# Patient Record
Sex: Male | Born: 1970 | Race: White | Hispanic: Yes | Smoking: Former smoker
Health system: Southern US, Community
[De-identification: ages and names within clinical notes are randomized; demographics above are authoritative.]

## PROBLEM LIST (undated history)

## (undated) DIAGNOSIS — J45909 Unspecified asthma, uncomplicated: Secondary | ICD-10-CM

## (undated) DIAGNOSIS — I10 Essential (primary) hypertension: Secondary | ICD-10-CM

## (undated) DIAGNOSIS — M1990 Unspecified osteoarthritis, unspecified site: Secondary | ICD-10-CM

## (undated) DIAGNOSIS — T7840XA Allergy, unspecified, initial encounter: Secondary | ICD-10-CM

## (undated) HISTORY — DX: Allergy, unspecified, initial encounter: T78.40XA

## (undated) HISTORY — DX: Unspecified osteoarthritis, unspecified site: M19.90

## (undated) HISTORY — DX: Essential (primary) hypertension: I10

## (undated) HISTORY — PX: APPENDECTOMY: SHX54

---

## 2013-11-20 ENCOUNTER — Telehealth: Payer: Self-pay | Admitting: *Deleted

## 2013-11-20 ENCOUNTER — Emergency Department
Admission: EM | Admit: 2013-11-20 | Discharge: 2013-11-20 | Disposition: A | Discharge status: Home / Self Care | Payer: BC Managed Care – PPO | Source: Home / Self Care | Attending: Family Medicine | Admitting: Family Medicine

## 2013-11-20 ENCOUNTER — Emergency Department (INDEPENDENT_AMBULATORY_CARE_PROVIDER_SITE_OTHER): Payer: BC Managed Care – PPO

## 2013-11-20 ENCOUNTER — Encounter: Payer: Self-pay | Admitting: Emergency Medicine

## 2013-11-20 DIAGNOSIS — B350 Tinea barbae and tinea capitis: Secondary | ICD-10-CM

## 2013-11-20 DIAGNOSIS — L738 Other specified follicular disorders: Secondary | ICD-10-CM

## 2013-11-20 DIAGNOSIS — M1712 Unilateral primary osteoarthritis, left knee: Secondary | ICD-10-CM

## 2013-11-20 DIAGNOSIS — IMO0002 Reserved for concepts with insufficient information to code with codable children: Secondary | ICD-10-CM

## 2013-11-20 DIAGNOSIS — M25569 Pain in unspecified knee: Secondary | ICD-10-CM

## 2013-11-20 DIAGNOSIS — M1710 Unilateral primary osteoarthritis, unspecified knee: Secondary | ICD-10-CM

## 2013-11-20 HISTORY — DX: Unspecified asthma, uncomplicated: J45.909

## 2013-11-20 MED ORDER — OXYCODONE-ACETAMINOPHEN 5-325 MG PO TABS: ORAL_TABLET | ORAL | Status: DC

## 2013-11-20 MED ORDER — MELOXICAM 15 MG PO TABS: 15.0000 mg | ORAL_TABLET | Freq: Every day | ORAL | Status: DC

## 2013-11-20 MED ORDER — DOXYCYCLINE HYCLATE 100 MG PO CAPS: 100.0000 mg | ORAL_CAPSULE | Freq: Two times a day (BID) | ORAL | Status: DC

## 2013-11-20 MED ORDER — DERMA-SMOOTHE/FS BODY 0.01 % EX OIL: TOPICAL_OIL | CUTANEOUS | Status: DC

## 2013-11-20 NOTE — ED Provider Notes (Signed)
CSN: [id number]     Arrival date & time 11/20/13  1202 History   First MD Initiated Contact with Patient 11/20/13 1257     Chief Complaint  Patient presents with  . Knee Pain      HPI Comments: Patient states that he has had gradually increasing pain in his left knee for about two years, becoming worse over the past month.  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  The knee often feels as if it may give way but not lock up.  He has had poor response to Aleve, and Flexeril helps somewhat at night.  He has taken his wife's Percocet at night to sleep.  He also reports that he has gained about 175 pounds over the past two years.  He also complains of recurrent pruritic sores on his posterior scalp.  Patient is a 43 y.o. male presenting with knee pain. The history is provided by the patient and the spouse.  Knee Pain Location:  Knee Time since incident:  1 month Injury: no   Knee location:  L knee Pain details:    Quality:  Aching   Radiates to:  Does not radiate   Severity:  Severe   Onset quality:  Gradual   Duration:  1 month   Timing:  Constant   Progression:  Worsening Chronicity:  Chronic Dislocation: no   Prior injury to area:  No Relieved by:  Nothing Worsened by:  Bearing weight Ineffective treatments:  NSAIDs Associated symptoms: decreased ROM, stiffness and swelling   Associated symptoms: no fever, no muscle weakness, no numbness and no tingling   Risk factors: obesity   Risk factors: no frequent fractures and no recent illness     Past Medical History  Diagnosis Date  . Asthma    Past Surgical History  Procedure Laterality Date  . Appendectomy     Family History  Problem Relation Age of Onset  . Diabetes Mother   . Hypertension Father    History  Substance Use Topics  . Smoking status: Current Every Day Smoker -- 0.50 packs/day    Types: Cigarettes  . Smokeless tobacco: Never Used  . Alcohol Use: Yes     Review of Systems  Constitutional: Negative for fever.  Musculoskeletal: Positive for stiffness.  Skin: Positive for rash.       He complains of pruritic sores on his posterior scalp    Allergies  Bee venom  Home Medications   Current Outpatient Rx  Name  Route  Sig  Dispense  Refill  . albuterol (PROVENTIL) (2.5 MG/3ML) 0.083% nebulizer solution   Nebulization   Take 2.5 mg by nebulization every 6 (six) hours as needed for wheezing or shortness of breath.         Marland Kitchen. albuterol (PROVENTIL) (5 MG/ML) 0.5% nebulizer solution   Nebulization   Take 2.5 mg by nebulization every 6 (six) hours as needed for wheezing or shortness of breath.         . doxycycline (VIBRAMYCIN) 100 MG capsule   Oral   Take 1 capsule (100 mg total) by mouth 2 (two) times daily.   20 capsule   0   . Fluocinolone Acetonide (DERMA-SMOOTHE/FS BODY) 0.01 % OIL      Apply to scalp at bedtime; wash off next morning   1 Bottle   0   . meloxicam (MOBIC) 15 MG tablet   Oral   Take  1 tablet (15 mg total) by mouth daily. Take with food each morning   15 tablet   0   . oxyCODONE-acetaminophen (ROXICET) 5-325 MG per tablet      Take one tab by mouth at bedtime as needed for pain   8 tablet   0    BP 133/90  Pulse 92  Resp 16  Ht 5\' 7"  (1.702 m)  Wt 321 lb (145.605 kg)  BMI 50.26 kg/m2  SpO2 98% Physical Exam Nursing notes and Vital Signs reviewed. Appearance:  Patient appears stated age, and in no acute distress.  Patient is obese (BMI 50.3) Head:  Occipital scalp has scattered follicular excoriations 3 to 5mm dia. Eyes:  Pupils are equal, round, and reactive to light and accomodation.  Extraocular movement is intact.  Conjunctivae are not inflamed  Pharynx:  Normal Neck:  Supple.  No adenopathy Lungs:  Clear to auscultation.  Breath sounds are equal.  Heart:  Regular rate and rhythm without murmurs, rubs, or gallops.  Extremities:  No edema.  Left knee has decreased range of motion:   Difficulty with full passive flexion.  Tenderness over medial joint line.  Knee stable.  Negative McMurray test.  Mild swelling but no warmth or erythema.    ED Course  Procedures      Imaging Review Dg Knee Complete 4 Views Left  11/20/2013   CLINICAL DATA Left knee pain for 2 months, no injury  EXAM LEFT KNEE - COMPLETE 4+ VIEW  COMPARISON None.  FINDINGS There is only mild degenerative spurring from the medial compartment. No fracture is seen and no definite effusion is noted although the lateral view is suboptimal to assess for effusion.  IMPRESSION Mild degenerative change medially.  No acute abnormality.  SIGNATURE  Electronically Signed   By: Dwyane Dee M.D.   On: 11/20/2013 13:31     MDM   1. 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 Severe obesity (BMI >= 40)   3. Folliculitis barbae    Continue Flexeril Begin Mobic.  Begin doxycycline and DermaSmoothe FS.  Rx for Percocet at bedtime Followup with Dr. Rodney Langton tomorrow; consider joint injection.     Lattie Haw, MD 11/20/13 [phone]

## 2013-11-20 NOTE — Discharge Instructions (Signed)
Wear and Tear Disorders of the Knee (Arthritis, Osteoarthritis)  Everyone will experience wear and tear injuries (arthritis, osteoarthritis) of the knee. These are the changes we all get as we age. They come from the joint stress of daily living. The amount of cartilage damage in your knee and your symptoms determine if you need surgery. Mild problems require approximately two months recovery time. More severe problems take several months to recover. With mild problems, your surgeon may find worn and rough cartilage surfaces. With severe changes, your surgeon may find cartilage that has completely worn away and exposed the bone. Loose bodies of bone and cartilage, bone spurs (excess bone growth), and injuries to the menisci (cushions between the large bones of your leg) are also common. All of these problems can cause pain.  For a mild wear and tear problem, rough cartilage may simply need to be shaved and smoothed. For more severe problems with areas of exposed bone, your surgeon may use an instrument for roughing up the bone surfaces to stimulate new cartilage growth. Loose bodies are usually removed. Torn menisci may be trimmed or repaired.  ABOUT THE ARTHROSCOPIC PROCEDURE  Arthroscopy is a surgical technique. It allows your orthopedic surgeon to diagnose and treat your knee injury with accuracy. The surgeon looks into your knee through a small scope. The scope is like a small (pencil-sized) telescope. Arthroscopy is less invasive than open knee surgery. You can expect a more rapid recovery. After the procedure, you will be moved to a recovery area until most of the effects of the medication have worn off. Your caregiver will discuss the test results with you.  RECOVERY  The severity of the arthritis and the type of procedure performed will determine recovery time. Other important factors include age, physical condition, medical conditions, and the type of rehabilitation program. Strengthening your muscles after  arthroscopy helps guarantee a better recovery. Follow your caregiver's instructions. Use crutches, rest, elevate, ice, and do knee exercises as instructed. Your caregivers will help you and instruct you with exercises and other physical therapy required to regain your mobility, muscle strength, and functioning following surgery. Only take over-the-counter or prescription medicines for pain, discomfort, or fever as directed by your caregiver.   SEEK MEDICAL CARE IF:   · There is increased bleeding (more than a small spot) from the wound.  · You notice redness, swelling, or increasing pain in the wound.  · Pus is coming from wound.  · You develop an unexplained oral temperature above 102° F (38.9° C) , or as your caregiver suggests.  · You notice a foul smell coming from the wound or dressing.  · You have severe pain with motion of the knee.  SEEK IMMEDIATE MEDICAL CARE IF:   · You develop a rash.  · You have difficulty breathing.  · You have any allergic problems.  MAKE SURE YOU:   · Understand these instructions.  · Will watch your condition.  · Will get help right away if you are not doing well or get worse.  Document Released: 08/26/2000 Document Revised: 11/21/2011 Document Reviewed: 01/23/2008  [employer]® Patient Information ©2014 [employer].

## 2013-11-20 NOTE — ED Notes (Signed)
Jonathan Villarreal c/o left knee pain and swelling x 1 month without injury. No previous injury or surgeries.

## 2013-11-21 ENCOUNTER — Ambulatory Visit (INDEPENDENT_AMBULATORY_CARE_PROVIDER_SITE_OTHER): Payer: BC Managed Care – PPO | Admitting: Sports Medicine

## 2013-11-21 ENCOUNTER — Encounter: Payer: Self-pay | Admitting: Sports Medicine

## 2013-11-21 VITALS — BP 147/82 | HR 88 | Ht 66.0 in | Wt 324.0 lb

## 2013-11-21 DIAGNOSIS — IMO0002 Reserved for concepts with insufficient information to code with codable children: Secondary | ICD-10-CM

## 2013-11-21 DIAGNOSIS — M1712 Unilateral primary osteoarthritis, left knee: Secondary | ICD-10-CM

## 2013-11-21 DIAGNOSIS — E669 Obesity, unspecified: Secondary | ICD-10-CM | POA: Insufficient documentation

## 2013-11-21 DIAGNOSIS — M170 Bilateral primary osteoarthritis of knee: Secondary | ICD-10-CM | POA: Insufficient documentation

## 2013-11-21 DIAGNOSIS — M1710 Unilateral primary osteoarthritis, unspecified knee: Secondary | ICD-10-CM

## 2013-11-21 MED ORDER — PHENTERMINE HCL 37.5 MG PO CAPS: 37.5000 mg | ORAL_CAPSULE | ORAL | Status: DC

## 2013-11-21 NOTE — Assessment & Plan Note (Signed)
Phentermine, nutrition referral.

## 2013-11-21 NOTE — Progress Notes (Signed)
   Subjective:    I'm seeing this patient as a consultation for: Dr. Cathren HarshBeese  CC: Left knee pain  HPI: This is a pleasant 43 year old male, for many years she's had pain that he localizes the joint lines bilaterally, moderate, persistent, gelling in the morning, worse at the end of the day as well after a long period of walking. He was given oral NSAIDs which have been ineffective, he has been using his wife's Percocet. He has never had an injection.  Obesity: Present for a long time, understands this worsens his osteoarthritis symptoms, he has no PCP, and would like to discuss weight loss medication.  Past medical history, Surgical history, Family history not pertinant except as noted below, Social history, Allergies, and medications have been entered into the medical record, reviewed, and no changes needed.   Review of Systems: No headache, visual changes, nausea, vomiting, diarrhea, constipation, dizziness, abdominal pain, skin rash, fevers, chills, night sweats, weight loss, swollen lymph nodes, body aches, joint swelling, muscle aches, chest pain, shortness of breath, mood changes, visual or auditory hallucinations.   Objective:   General: Well Developed, well nourished, and in no acute distress.  Neuro/Psych: Alert and oriented x3, extra-ocular muscles intact, able to move all 4 extremities, sensation grossly intact. Skin: Warm and dry, no rashes noted.  Respiratory: Not using accessory muscles, speaking in full sentences, trachea midline.  Cardiovascular: Pulses palpable, no extremity edema. Abdomen: Does not appear distended. Left Knee: Minimal swelling, tenderness to palpation particularly along the medial joint line. ROM full in flexion and extension and lower leg rotation. Ligaments with solid consistent endpoints including ACL, PCL, LCL, MCL. Negative Mcmurray's, Apley's, and Thessalonian tests. Non painful patellar compression. Patellar glide without crepitus. Patellar and  quadriceps tendons unremarkable. Hamstring and quadriceps strength is normal.   Procedure: Real-time Ultrasound Guided Injection of left knee Device: GE Logiq E  Verbal informed consent obtained.  Time-out conducted.  Noted no overlying erythema, induration, or other signs of local infection.  Skin prepped in a sterile fashion.  Local anesthesia: Topical Ethyl chloride.  With sterile technique and under real time ultrasound guidance:  25-gauge needle advanced into the suprapatellar recess, 2 cc Kenalog 40, 4 cc lidocaine injected easily. Completed without difficulty  Pain immediately resolved suggesting accurate placement of the medication.  Advised to call if fevers/chills, erythema, induration, drainage, or persistent bleeding.  Images permanently stored and available for review in the ultrasound unit.  Impression: Technically successful ultrasound guided injection. Impression and Recommendations:   This case required medical decision making of moderate complexity.

## 2013-11-21 NOTE — Assessment & Plan Note (Signed)
Persistent pain despite Mobic, Percocet. He was seen in urgent care, and referred to me for further evaluation and definitive treatment. Injection as above, formal physical therapy. Return to see me in one month

## 2013-12-19 ENCOUNTER — Ambulatory Visit: Payer: BC Managed Care – PPO | Admitting: Sports Medicine

## 2013-12-31 ENCOUNTER — Ambulatory Visit (INDEPENDENT_AMBULATORY_CARE_PROVIDER_SITE_OTHER): Payer: BC Managed Care – PPO | Admitting: Sports Medicine

## 2013-12-31 ENCOUNTER — Encounter: Payer: Self-pay | Admitting: Sports Medicine

## 2013-12-31 VITALS — BP 141/87 | HR 87 | Ht 66.0 in | Wt 321.0 lb

## 2013-12-31 DIAGNOSIS — IMO0002 Reserved for concepts with insufficient information to code with codable children: Secondary | ICD-10-CM

## 2013-12-31 DIAGNOSIS — M1710 Unilateral primary osteoarthritis, unspecified knee: Secondary | ICD-10-CM

## 2013-12-31 DIAGNOSIS — E669 Obesity, unspecified: Secondary | ICD-10-CM

## 2013-12-31 DIAGNOSIS — M1712 Unilateral primary osteoarthritis, left knee: Secondary | ICD-10-CM

## 2013-12-31 MED ORDER — PHENTERMINE HCL 37.5 MG PO CAPS: 37.5000 mg | ORAL_CAPSULE | ORAL | Status: DC

## 2013-12-31 NOTE — Progress Notes (Signed)
  Subjective:    CC: Follow up  HPI: Left knee osteoarthritis : continues to be pain-free after injection, does get some swelling after a long day, not using any bracing.  Obesity: Only a few pound weight loss on phentermine, has not yet seen the nutritionist.  Past medical history, Surgical history, Family history not pertinant except as noted below, Social history, Allergies, and medications have been entered into the medical record, reviewed, and no changes needed.   Review of Systems: No fevers, chills, night sweats, weight loss, chest pain, or shortness of breath.   Objective:    General: Well Developed, well nourished, and in no acute distress.  Neuro: Alert and oriented x3, extra-ocular muscles intact, sensation grossly intact.  HEENT: Normocephalic, atraumatic, pupils equal round reactive to light, neck supple, no masses, no lymphadenopathy, thyroid nonpalpable.  Skin: Warm and dry, no rashes. Cardiac: Regular rate and rhythm, no murmurs rubs or gallops, no lower extremity edema.  Respiratory: Clear to auscultation bilaterally. Not using accessory muscles, speaking in full sentences. Left Knee: Normal to inspection with no erythema or effusion or obvious bony abnormalities. Palpation normal with no warmth, joint line tenderness, patellar tenderness, or condyle tenderness. ROM full in flexion and extension and lower leg rotation. Ligaments with solid consistent endpoints including ACL, PCL, LCL, MCL. Negative Mcmurray's, Apley's, and Thessalonian tests. Non painful patellar compression. Patellar glide without crepitus. Patellar and quadriceps tendons unremarkable. Hamstring and quadriceps strength is normal.   Impression and Recommendations:

## 2013-12-31 NOTE — Assessment & Plan Note (Signed)
Pain-free after injection at the last visit. Continue weight loss, rehabilitation exercises at home, he will obtain a knee sleeve.

## 2013-12-31 NOTE — Assessment & Plan Note (Signed)
Only a slight weight loss since the last visit. Refilling phentermine, has not yet seen the nutritionist.

## 2014-01-28 ENCOUNTER — Ambulatory Visit: Payer: BC Managed Care – PPO | Admitting: Sports Medicine

## 2014-02-13 ENCOUNTER — Encounter: Payer: Self-pay | Admitting: Sports Medicine

## 2014-02-13 ENCOUNTER — Ambulatory Visit (INDEPENDENT_AMBULATORY_CARE_PROVIDER_SITE_OTHER): Payer: BC Managed Care – PPO | Admitting: Sports Medicine

## 2014-02-13 VITALS — BP 154/97 | HR 80 | Ht 66.0 in | Wt 317.0 lb

## 2014-02-13 DIAGNOSIS — M1712 Unilateral primary osteoarthritis, left knee: Secondary | ICD-10-CM

## 2014-02-13 DIAGNOSIS — I10 Essential (primary) hypertension: Secondary | ICD-10-CM

## 2014-02-13 DIAGNOSIS — E669 Obesity, unspecified: Secondary | ICD-10-CM

## 2014-02-13 DIAGNOSIS — M1710 Unilateral primary osteoarthritis, unspecified knee: Secondary | ICD-10-CM

## 2014-02-13 DIAGNOSIS — IMO0002 Reserved for concepts with insufficient information to code with codable children: Secondary | ICD-10-CM

## 2014-02-13 MED ORDER — HYDROCHLOROTHIAZIDE 25 MG PO TABS: 12.5000 mg | ORAL_TABLET | Freq: Every day | ORAL | Status: DC

## 2014-02-13 MED ORDER — PHENTERMINE HCL 37.5 MG PO CAPS: 37.5000 mg | ORAL_CAPSULE | ORAL | Status: DC

## 2014-02-13 NOTE — Assessment & Plan Note (Signed)
Did well after injection in March. There is a recurrence of swelling. Repeat aspiration and injection and referral for knee arthroscopy, he does have a degenerative meniscal tear or an old MRI from 2013.

## 2014-02-13 NOTE — Assessment & Plan Note (Signed)
Adding hydrochlorothiazide. Return in one month to recheck blood pressure.

## 2014-02-13 NOTE — Progress Notes (Signed)
  Subjective:    CC: Followup  HPI: Obesity: Moderate weight loss, no side effects.  Elevated blood pressure: Continues to be elevated.  Knee osteoarthritis: Left-sided, likely has a degenerative meniscal tear as well. Last injection worked for approximately 3 months, he has a recurrence of swelling and pain.  Past medical history, Surgical history, Family history not pertinant except as noted below, Social history, Allergies, and medications have been entered into the medical record, reviewed, and no changes needed.   Review of Systems: No fevers, chills, night sweats, weight loss, chest pain, or shortness of breath.   Objective:    General: Well Developed, well nourished, and in no acute distress.  Neuro: Alert and oriented x3, extra-ocular muscles intact, sensation grossly intact.  HEENT: Normocephalic, atraumatic, pupils equal round reactive to light, neck supple, no masses, no lymphadenopathy, thyroid nonpalpable.  Skin: Warm and dry, no rashes. Cardiac: Regular rate and rhythm, no murmurs rubs or gallops, no lower extremity edema.  Respiratory: Clear to auscultation bilaterally. Not using accessory muscles, speaking in full sentences.  Procedure: Real-time Ultrasound Guided aspiration Injection of left knee Device: GE Logiq E  Verbal informed consent obtained.  Time-out conducted.  Noted no overlying erythema, induration, or other signs of local infection.  Skin prepped in a sterile fashion.  Local anesthesia: Topical Ethyl chloride.  With sterile technique and under real time ultrasound guidance:  32 cc of straw-colored fluid aspirated, syringe switched and 2 cc Kenalog 40, 4 cc lidocaine injected. Completed without difficulty  Pain immediately resolved suggesting accurate placement of the medication.  Advised to call if fevers/chills, erythema, induration, drainage, or persistent bleeding.  Images permanently stored and available for review in the ultrasound unit.    Impression: Technically successful ultrasound guided injection.  Impression and Recommendations:

## 2014-02-13 NOTE — Assessment & Plan Note (Signed)
Refill and phentermine. Moderate weight loss. Emphasized the need to cut back on carbohydrates. Return in one month for a weight check and refills.

## 2014-03-13 ENCOUNTER — Ambulatory Visit: Payer: BC Managed Care – PPO | Admitting: Sports Medicine

## 2014-03-24 ENCOUNTER — Ambulatory Visit: Payer: BC Managed Care – PPO | Admitting: Sports Medicine

## 2014-04-01 ENCOUNTER — Encounter: Payer: Self-pay | Admitting: Sports Medicine

## 2014-04-01 ENCOUNTER — Ambulatory Visit (INDEPENDENT_AMBULATORY_CARE_PROVIDER_SITE_OTHER): Payer: BC Managed Care – PPO | Admitting: Sports Medicine

## 2014-04-01 VITALS — BP 148/92 | HR 87 | Ht 66.0 in | Wt 310.0 lb

## 2014-04-01 DIAGNOSIS — M1712 Unilateral primary osteoarthritis, left knee: Secondary | ICD-10-CM

## 2014-04-01 DIAGNOSIS — M1710 Unilateral primary osteoarthritis, unspecified knee: Secondary | ICD-10-CM

## 2014-04-01 MED ORDER — TRAMADOL HCL 50 MG PO TABS: 100.0000 mg | ORAL_TABLET | Freq: Three times a day (TID) | ORAL | Status: DC

## 2014-04-01 NOTE — Progress Notes (Signed)
  Subjective:    CC: Followup  HPI: Left knee osteoarthritis : moderate response to steroid injection, did see Dr. Luiz BlareGraves who recommended knee replacement, he is unable to do a knee replacement at this time, he has to take care of his wife and work for now, he is educated other methods to improve his pain. Pain is moderate, persistent, he does get swelling and significant effusions.  Past medical history, Surgical history, Family history not pertinant except as noted below, Social history, Allergies, and medications have been entered into the medical record, reviewed, and no changes needed.   Review of Systems: No fevers, chills, night sweats, weight loss, chest pain, or shortness of breath.   Objective:    General: Well Developed, well nourished, and in no acute distress.  Neuro: Alert and oriented x3, extra-ocular muscles intact, sensation grossly intact.  HEENT: Normocephalic, atraumatic, pupils equal round reactive to light, neck supple, no masses, no lymphadenopathy, thyroid nonpalpable.  Skin: Warm and dry, no rashes. Cardiac: Regular rate and rhythm, no murmurs rubs or gallops, no lower extremity edema.  Respiratory: Clear to auscultation bilaterally. Not using accessory muscles, speaking in full sentences. Left Knee: Swollen with a palpable fluid wave, and tender to palpation at the medial joint line. ROM normal in flexion and extension and lower leg rotation. Ligaments with solid consistent endpoints including ACL, PCL, LCL, MCL. Negative Mcmurray's and provocative meniscal tests. Non painful patellar compression. Patellar and quadriceps tendons unremarkable. Hamstring and quadriceps strength is normal.  Impression and Recommendations:

## 2014-04-01 NOTE — Assessment & Plan Note (Addendum)
We will try to get [place] approved. High-dose tramadol until then. We can also consider orthotics, an unloader brace, and glucosamine/chondroitin as well as devils claw.

## 2014-04-03 ENCOUNTER — Telehealth: Payer: Self-pay

## 2014-04-03 ENCOUNTER — Encounter: Payer: Self-pay | Admitting: Sports Medicine

## 2014-04-03 ENCOUNTER — Ambulatory Visit (INDEPENDENT_AMBULATORY_CARE_PROVIDER_SITE_OTHER): Payer: BC Managed Care – PPO | Admitting: Sports Medicine

## 2014-04-03 VITALS — BP 135/87 | HR 84 | Ht 66.0 in | Wt 306.0 lb

## 2014-04-03 DIAGNOSIS — M1710 Unilateral primary osteoarthritis, unspecified knee: Secondary | ICD-10-CM

## 2014-04-03 DIAGNOSIS — M1712 Unilateral primary osteoarthritis, left knee: Secondary | ICD-10-CM

## 2014-04-03 NOTE — Assessment & Plan Note (Signed)
OrthoVisc injection #1 of 4. Return in one week for injection #2 into the left knee.

## 2014-04-03 NOTE — Progress Notes (Signed)
  Procedure: Real-time Ultrasound Guided aspiration/Injection of left knee Device: GE Logiq E  Verbal informed consent obtained.  Time-out conducted.  Noted no overlying erythema, induration, or other signs of local infection.  Skin prepped in a sterile fashion.  Local anesthesia: Topical Ethyl chloride.  With sterile technique and under real time ultrasound guidance:  20 cc of straw-colored fluid aspirated, syringe switched and 30 mg/2 mL of OrthoVisc (sodium hyaluronate) in a prefilled syringe was injected easily into the knee through a 18-gauge needle. Completed without difficulty  Pain immediately resolved suggesting accurate placement of the medication.  Advised to call if fevers/chills, erythema, induration, drainage, or persistent bleeding.  Images permanently stored and available for review in the ultrasound unit.  Impression: Technically successful ultrasound guided injection.

## 2014-04-03 NOTE — Telephone Encounter (Signed)
Pt wife called and started that pt called her from worked and stated that the injection site was very painful and had a lump. Pt was informed that it is from the injection and to apply cold compress to the site and rub the site. That basically it is bruised but is will heal./Pride Gonzales,CMA

## 2014-04-17 ENCOUNTER — Encounter: Payer: Self-pay | Admitting: Sports Medicine

## 2014-04-17 ENCOUNTER — Ambulatory Visit (INDEPENDENT_AMBULATORY_CARE_PROVIDER_SITE_OTHER): Payer: BC Managed Care – PPO | Admitting: Sports Medicine

## 2014-04-17 VITALS — BP 151/104 | HR 95 | Ht 66.0 in | Wt 308.0 lb

## 2014-04-17 DIAGNOSIS — M1712 Unilateral primary osteoarthritis, left knee: Secondary | ICD-10-CM

## 2014-04-17 DIAGNOSIS — J4520 Mild intermittent asthma, uncomplicated: Secondary | ICD-10-CM | POA: Insufficient documentation

## 2014-04-17 DIAGNOSIS — M1710 Unilateral primary osteoarthritis, unspecified knee: Secondary | ICD-10-CM

## 2014-04-17 MED ORDER — ALBUTEROL SULFATE (2.5 MG/3ML) 0.083% IN NEBU: 2.5000 mg | INHALATION_SOLUTION | Freq: Four times a day (QID) | RESPIRATORY_TRACT | Status: DC | PRN

## 2014-04-17 MED ORDER — TRAMADOL HCL 50 MG PO TABS
100.0000 mg | ORAL_TABLET | Freq: Three times a day (TID) | ORAL | Status: DC
Start: 2014-04-17 — End: 2014-06-23

## 2014-04-17 NOTE — Progress Notes (Signed)
  Procedure: Real-time Ultrasound Guided aspiration/Injection of left knee Device: GE Logiq E  Verbal informed consent obtained.  Time-out conducted.  Noted no overlying erythema, induration, or other signs of local infection.  Skin prepped in a sterile fashion.  Local anesthesia: Topical Ethyl chloride.  With sterile technique and under real time ultrasound guidance:  30 mg/2 mL of OrthoVisc (sodium hyaluronate) in a prefilled syringe was injected easily into the knee through a 18-gauge needle. Completed without difficulty  Pain immediately resolved suggesting accurate placement of the medication.  Advised to call if fevers/chills, erythema, induration, drainage, or persistent bleeding.  Images permanently stored and available for review in the ultrasound unit.  Impression: Technically successful ultrasound guided injection.

## 2014-04-17 NOTE — Assessment & Plan Note (Signed)
Refill albuterol. 

## 2014-04-17 NOTE — Assessment & Plan Note (Signed)
OrthoVisc injection #2. Refilling pain medication. Return in one week for injection #3.

## 2014-04-25 ENCOUNTER — Ambulatory Visit (INDEPENDENT_AMBULATORY_CARE_PROVIDER_SITE_OTHER): Payer: BC Managed Care – PPO | Admitting: Sports Medicine

## 2014-04-25 ENCOUNTER — Encounter: Payer: Self-pay | Admitting: Sports Medicine

## 2014-04-25 VITALS — BP 144/89 | HR 107 | Ht 66.0 in | Wt 303.0 lb

## 2014-04-25 DIAGNOSIS — M1710 Unilateral primary osteoarthritis, unspecified knee: Secondary | ICD-10-CM

## 2014-04-25 DIAGNOSIS — E669 Obesity, unspecified: Secondary | ICD-10-CM

## 2014-04-25 DIAGNOSIS — M1712 Unilateral primary osteoarthritis, left knee: Secondary | ICD-10-CM

## 2014-04-25 MED ORDER — DIAZEPAM 5 MG PO TABS: ORAL_TABLET | ORAL | Status: DC

## 2014-04-25 MED ORDER — PHENTERMINE HCL 37.5 MG PO CAPS: 37.5000 mg | ORAL_CAPSULE | ORAL | Status: DC

## 2014-04-25 NOTE — Progress Notes (Signed)
  Subjective:    CC: Followup  HPI: Obesity: Good weight loss, needs a refill.  Left knee osteoarthritis : Here for OrthoVisc injection.  Past medical history, Surgical history, Family history not pertinant except as noted below, Social history, Allergies, and medications have been entered into the medical record, reviewed, and no changes needed.   Review of Systems: No fevers, chills, night sweats, weight loss, chest pain, or shortness of breath.   Objective:    General: Well Developed, well nourished, and in no acute distress.  Neuro: Alert and oriented x3, extra-ocular muscles intact, sensation grossly intact.  HEENT: Normocephalic, atraumatic, pupils equal round reactive to light, neck supple, no masses, no lymphadenopathy, thyroid nonpalpable.  Skin: Warm and dry, no rashes. Cardiac: Regular rate and rhythm, no murmurs rubs or gallops, no lower extremity edema.  Respiratory: Clear to auscultation bilaterally. Not using accessory muscles, speaking in full sentences.  Procedure: Real-time Ultrasound Guided  Aspiration/Injection of left knee Device: GE Logiq E  Verbal informed consent obtained.  Time-out conducted.  Noted no overlying erythema, induration, or other signs of local infection.  Skin prepped in a sterile fashion.  Local anesthesia: Topical Ethyl chloride.  With sterile technique and under real time ultrasound guidance:  35 cc of straw-colored fluid aspirated, syringe switched and 30 mg/2 mL of OrthoVisc (sodium hyaluronate) in a prefilled syringe was injected easily into the knee through a 18-gauge needle. Completed without difficulty  Pain immediately resolved suggesting accurate placement of the medication.  Advised to call if fevers/chills, erythema, induration, drainage, or persistent bleeding.  Images permanently stored and available for review in the ultrasound unit.  Impression: Technically successful ultrasound guided injection.  Impression and  Recommendations:

## 2014-04-25 NOTE — Assessment & Plan Note (Signed)
Phentermine. Return in one month for a weight check and refills.

## 2014-04-25 NOTE — Assessment & Plan Note (Signed)
Aspiration and OrthoVisc injection #3. Return in one week for #4, Valium 2 hours before procedure.

## 2014-05-01 ENCOUNTER — Telehealth: Payer: Self-pay

## 2014-05-01 NOTE — Telephone Encounter (Signed)
I gave him the prescription for Valium at the end of the last office visit check the notes and medication list

## 2014-05-01 NOTE — Telephone Encounter (Signed)
Patient is coming in the office on 05/02/14 to get knee injection, he is requesting a [facility] [address] Rhonda Cunningham,CMA

## 2014-05-02 ENCOUNTER — Encounter: Payer: Self-pay | Admitting: Sports Medicine

## 2014-05-02 ENCOUNTER — Ambulatory Visit (INDEPENDENT_AMBULATORY_CARE_PROVIDER_SITE_OTHER): Payer: BC Managed Care – PPO | Admitting: Sports Medicine

## 2014-05-02 VITALS — BP 150/94 | HR 73 | Ht 65.0 in | Wt 313.0 lb

## 2014-05-02 DIAGNOSIS — M1710 Unilateral primary osteoarthritis, unspecified knee: Secondary | ICD-10-CM

## 2014-05-02 DIAGNOSIS — M1712 Unilateral primary osteoarthritis, left knee: Secondary | ICD-10-CM

## 2014-05-02 NOTE — Telephone Encounter (Signed)
Patient has been informed. Cristalle Rohm,CMA  

## 2014-05-02 NOTE — Assessment & Plan Note (Signed)
OrthoVisc injection #4 of 4 into the left knee today, no response yet, he is scheduled for total knee arthroplasty.

## 2014-05-02 NOTE — Progress Notes (Signed)
   Procedure: Real-time Ultrasound Guided  Aspiration/Injection of left knee Device: GE Logiq E  Verbal informed consent obtained.  Time-out conducted.  Noted no overlying erythema, induration, or other signs of local infection.  Skin prepped in a sterile fashion.  Local anesthesia: Topical Ethyl chloride.  With sterile technique and under real time ultrasound guidance:  30 cc of straw-colored fluid aspirated, syringe switched and 30 mg/2 mL of OrthoVisc (sodium hyaluronate) in a prefilled syringe was injected easily into the knee through a 18-gauge needle. Completed without difficulty  Pain immediately resolved suggesting accurate placement of the medication.  Advised to call if fevers/chills, erythema, induration, drainage, or persistent bleeding.  Images permanently stored and available for review in the ultrasound unit.  Impression: Technically successful ultrasound guided injection.  Impression and Recommendations:

## 2014-05-13 ENCOUNTER — Other Ambulatory Visit: Payer: Self-pay | Admitting: Sports Medicine

## 2014-05-23 ENCOUNTER — Ambulatory Visit: Payer: BC Managed Care – PPO | Admitting: Sports Medicine

## 2014-06-13 ENCOUNTER — Other Ambulatory Visit: Payer: Self-pay

## 2014-06-13 DIAGNOSIS — I10 Essential (primary) hypertension: Secondary | ICD-10-CM

## 2014-06-13 MED ORDER — HYDROCHLOROTHIAZIDE 25 MG PO TABS: 12.5000 mg | ORAL_TABLET | Freq: Every day | ORAL | Status: DC

## 2014-06-18 ENCOUNTER — Other Ambulatory Visit: Payer: Self-pay

## 2014-06-18 MED ORDER — ALBUTEROL SULFATE (2.5 MG/3ML) 0.083% IN NEBU: 2.5000 mg | INHALATION_SOLUTION | Freq: Four times a day (QID) | RESPIRATORY_TRACT | Status: DC | PRN

## 2014-06-23 ENCOUNTER — Other Ambulatory Visit: Payer: Self-pay | Admitting: *Deleted

## 2014-06-23 DIAGNOSIS — M1712 Unilateral primary osteoarthritis, left knee: Secondary | ICD-10-CM

## 2014-06-23 MED ORDER — TRAMADOL HCL 50 MG PO TABS: 100.0000 mg | ORAL_TABLET | Freq: Three times a day (TID) | ORAL | Status: DC

## 2014-07-15 ENCOUNTER — Other Ambulatory Visit: Payer: Self-pay | Admitting: Sports Medicine

## 2014-08-03 ENCOUNTER — Other Ambulatory Visit: Payer: Self-pay | Admitting: Sports Medicine

## 2014-08-06 ENCOUNTER — Other Ambulatory Visit: Payer: Self-pay | Admitting: Sports Medicine

## 2014-09-07 ENCOUNTER — Other Ambulatory Visit: Payer: Self-pay | Admitting: Sports Medicine

## 2014-09-29 ENCOUNTER — Other Ambulatory Visit: Payer: Self-pay | Admitting: Sports Medicine

## 2014-10-01 ENCOUNTER — Other Ambulatory Visit: Payer: Self-pay | Admitting: Sports Medicine

## 2014-10-16 ENCOUNTER — Other Ambulatory Visit: Payer: Self-pay | Admitting: Sports Medicine

## 2014-11-17 ENCOUNTER — Ambulatory Visit (INDEPENDENT_AMBULATORY_CARE_PROVIDER_SITE_OTHER): Payer: BLUE CROSS/BLUE SHIELD | Admitting: Sports Medicine

## 2014-11-17 ENCOUNTER — Encounter: Payer: Self-pay | Admitting: Sports Medicine

## 2014-11-17 VITALS — BP 151/93 | HR 87 | Wt 317.0 lb

## 2014-11-17 DIAGNOSIS — M1712 Unilateral primary osteoarthritis, left knee: Secondary | ICD-10-CM | POA: Diagnosis not present

## 2014-11-17 DIAGNOSIS — L02611 Cutaneous abscess of right foot: Secondary | ICD-10-CM

## 2014-11-17 DIAGNOSIS — Z23 Encounter for immunization: Secondary | ICD-10-CM | POA: Diagnosis not present

## 2014-11-17 MED ORDER — OXYCODONE-ACETAMINOPHEN 5-325 MG PO TABS: 1.0000 | ORAL_TABLET | Freq: Three times a day (TID) | ORAL | Status: DC | PRN

## 2014-11-17 MED ORDER — DOXYCYCLINE HYCLATE 100 MG PO TABS: 100.0000 mg | ORAL_TABLET | Freq: Two times a day (BID) | ORAL | Status: AC

## 2014-11-17 NOTE — Progress Notes (Signed)
  Subjective:    CC: Foot pain, knee pain  HPI: Left knee osteoarthritis: Knows he needs a knee replacement, trying to find time to do this, an aspiration today, has a significant effusion. Moderate, persistent pain localized at the joint line and in the suprapatellar recess.  Right foot pain: Earlier was playing with his son, stepped on a nail, since then he's had significant pain, swelling of the entire right foot with no constitutional symptoms.  Past medical history, Surgical history, Family history not pertinant except as noted below, Social history, Allergies, and medications have been entered into the medical record, reviewed, and no changes needed.   Review of Systems: No fevers, chills, night sweats, weight loss, chest pain, or shortness of breath.   Objective:    General: Well Developed, well nourished, and in no acute distress.  Neuro: Alert and oriented x3, extra-ocular muscles intact, sensation grossly intact.  HEENT: Normocephalic, atraumatic, pupils equal round reactive to light, neck supple, no masses, no lymphadenopathy, thyroid nonpalpable.  Skin: Warm and dry, no rashes. Cardiac: Regular rate and rhythm, no murmurs rubs or gallops, no lower extremity edema.  Respiratory: Clear to auscultation bilaterally. Not using accessory muscles, speaking in full sentences. Right foot: Swollen, mildly erythema on the plantar aspect with visible purulence under the heel.  Procedure: Real-time Ultrasound Guided aspiration of left knee Device: GE Logiq E  Verbal informed consent obtained.  Time-out conducted.  Noted no overlying erythema, induration, or other signs of local infection.  Skin prepped in a sterile fashion.  Local anesthesia: Topical Ethyl chloride.  With sterile technique and under real time ultrasound guidance: Aspirated 10 mL of straw-colored fluid through the suprapatellar recess . Completed without difficulty  Pain immediately resolved suggesting accurate  placement of the medication.  Advised to call if fevers/chills, erythema, induration, drainage, or persistent bleeding.  Images permanently stored and available for review in the ultrasound unit.  Impression: Technically successful ultrasound guided injection.  Procedure: Real-time Ultrasound Guided right tibial nerve peripheral nerve block.  Device: GE Logiq E  Verbal informed consent obtained.  Time-out conducted.  Noted no overlying erythema, induration, or other signs of local infection.  Skin prepped in a sterile fashion.  Local anesthesia: Topical Ethyl chloride.  With sterile technique and under real time ultrasound guidance:  Visualized tibial nerve adjacent to the tibial artery, I injected a total of 10 mL of lidocaine without epinephrine around the tibial nerve resulting in good anesthesia of the plantar aspect of the foot. Completed without difficulty  Advised to call if fevers/chills, erythema, induration, drainage, or persistent bleeding.  Images permanently stored and available for review in the ultrasound unit.  Impression: Technically successful ultrasound guided injection peripheral nerve block for plantar abscess.  Simple Incision and drainage of abscess. Risks, benefits, and alternatives explained and consent obtained. Time out conducted. Surface cleaned with alcohol. 10cc lidocaine with epinephine infiltrated around abscess. Adequate anesthesia ensured. Area prepped and draped in a sterile fashion. #11 blade used to make a stab incision into abscess. Cultures taken. Pus expressed with pressure. Curved hemostat used to explore 4 quadrants and loculations broken up. Further purulence expressed. Hemostasis achieved. Pt stable. Aftercare and follow-up advised.  Impression and Recommendations:    I spent 40 minutes with this patient, greater than 50% was face-to-face time counseling regarding the above multiple diagnoses.

## 2014-11-17 NOTE — Assessment & Plan Note (Signed)
Simple incision and drainage under tibial nerve block. Doxycycline, Percocet, wound culture obtained. X-rays, tetanus shot. Return in one week.

## 2014-11-17 NOTE — Patient Instructions (Signed)
Incision and Drainage Care After Refer to this sheet in the next few weeks. These instructions provide you with information on caring for yourself after your procedure. Your caregiver may also give you more specific instructions. Your treatment has been planned according to current medical practices, but problems sometimes occur. Call your caregiver if you have any problems or questions after your procedure. HOME CARE INSTRUCTIONS   If antibiotic medicine is given, take it as directed. Finish it even if you start to feel better.  Only take over-the-counter or prescription medicines for pain, discomfort, or fever as directed by your caregiver.  Keep all follow-up appointments as directed by your caregiver.  Change any bandages (dressings) as directed by your caregiver. Replace old dressings with clean dressings.  Wash your hands before and after caring for your wound. You will receive specific instructions for cleansing and caring for your wound.  SEEK MEDICAL CARE IF:   You have increased pain, swelling, or redness around the wound.  You have increased drainage, smell, or bleeding from the wound.  You have muscle aches, chills, or you feel generally sick.  You have a fever. MAKE SURE YOU:   Understand these instructions.  Will watch your condition.  Will get help right away if you are not doing well or get worse. Document Released: 11/21/2011 Document Reviewed: 11/21/2011 Exit[employer] Patient Information 2015 [employer]. This information is not intended to replace advice given to you by your health care provider. Make sure you discuss any questions you have with your health care provider.  

## 2014-11-17 NOTE — Assessment & Plan Note (Signed)
Aspiration of 10 mL without injection as above.

## 2014-11-20 ENCOUNTER — Other Ambulatory Visit: Payer: Self-pay | Admitting: Sports Medicine

## 2014-11-21 LAB — WOUND CULTURE

## 2014-11-24 ENCOUNTER — Encounter: Payer: Self-pay | Admitting: Sports Medicine

## 2014-11-24 ENCOUNTER — Ambulatory Visit (INDEPENDENT_AMBULATORY_CARE_PROVIDER_SITE_OTHER): Payer: BLUE CROSS/BLUE SHIELD | Admitting: Sports Medicine

## 2014-11-24 VITALS — BP 133/95 | HR 81 | Wt 311.0 lb

## 2014-11-24 DIAGNOSIS — L02611 Cutaneous abscess of right foot: Secondary | ICD-10-CM

## 2014-11-24 DIAGNOSIS — M1712 Unilateral primary osteoarthritis, left knee: Secondary | ICD-10-CM | POA: Diagnosis not present

## 2014-11-24 NOTE — Assessment & Plan Note (Signed)
Doing much better after incision and drainage under tibial nerve block. There was growth of Staphylococcus aureus and Streptococcus agalactiae. Continue doxycycline.

## 2014-11-24 NOTE — Progress Notes (Signed)
  Subjective:    CC: Follow-up  HPI: Abscess heel right: Significantly better after incision and drainage and antibiotics, cultures did grow out Streptococcus agalactiae and Staphylococcus aureus, pansensitive.  Left knee osteoarthritis: Doing very well after aspiration at the last visit.  Past medical history, Surgical history, Family history not pertinant except as noted below, Social history, Allergies, and medications have been entered into the medical record, reviewed, and no changes needed.   Review of Systems: No fevers, chills, night sweats, weight loss, chest pain, or shortness of breath.   Objective:    General: Well Developed, well nourished, and in no acute distress.  Neuro: Alert and oriented x3, extra-ocular muscles intact, sensation grossly intact.  HEENT: Normocephalic, atraumatic, pupils equal round reactive to light, neck supple, no masses, no lymphadenopathy, thyroid nonpalpable.  Skin: Warm and dry, no rashes. Cardiac: Regular rate and rhythm, no murmurs rubs or gallops, no lower extremity edema.  Respiratory: Clear to auscultation bilaterally. Not using accessory muscles, speaking in full sentences. Right foot: Abscess appears to be healing well, only minimal tenderness. No erythema, induration, or drainage.  Impression and Recommendations:

## 2014-11-24 NOTE — Assessment & Plan Note (Signed)
Doing well after aspiration.

## 2014-12-01 ENCOUNTER — Other Ambulatory Visit: Payer: Self-pay | Admitting: Sports Medicine

## 2014-12-12 ENCOUNTER — Other Ambulatory Visit: Payer: Self-pay | Admitting: Sports Medicine

## 2015-01-20 ENCOUNTER — Other Ambulatory Visit: Payer: Self-pay | Admitting: Sports Medicine

## 2015-01-29 ENCOUNTER — Other Ambulatory Visit: Payer: Self-pay | Admitting: Sports Medicine

## 2015-02-14 ENCOUNTER — Encounter: Payer: Self-pay | Admitting: Emergency Medicine

## 2015-02-14 ENCOUNTER — Emergency Department (INDEPENDENT_AMBULATORY_CARE_PROVIDER_SITE_OTHER)
Admission: EM | Admit: 2015-02-14 | Discharge: 2015-02-14 | Disposition: A | Discharge status: Home / Self Care | Payer: BLUE CROSS/BLUE SHIELD | Source: Home / Self Care | Attending: Sports Medicine | Admitting: Sports Medicine

## 2015-02-14 DIAGNOSIS — M170 Bilateral primary osteoarthritis of knee: Secondary | ICD-10-CM

## 2015-02-14 MED ORDER — OXYCODONE-ACETAMINOPHEN 7.5-325 MG PO TABS: 1.0000 | ORAL_TABLET | Freq: Three times a day (TID) | ORAL | Status: DC | PRN

## 2015-02-14 MED ORDER — KETOROLAC TROMETHAMINE 30 MG/ML IJ SOLN
30.0000 mg | Freq: Once | INTRAMUSCULAR | Status: AC
  Administered 2015-02-14: 30 mg via INTRAMUSCULAR

## 2015-02-14 NOTE — ED Notes (Signed)
Pt c/o knee pain in both knees. He has hx of this but states it has gotten worse in the last 2 weeks.

## 2015-02-14 NOTE — ED Provider Notes (Addendum)
  Subjective:    CC: Bilateral knee pain  HPI: Jonathan Villarreal comes into the urgent care with severe bilateral knee pain, he has a history of end-stage bilateral osteoarthritis, he's been treated by me multiple times in the past with multiple aspirations, injections including Visco supplementation, pain is moderate, persistent, right worse than left, with swelling and localized at the medial joint line of both knees. No mechanical symptoms. We have been holding off on surgical referral due to financial issues.  Past medical history, Surgical history, Family history not pertinant except as noted below, Social history, Allergies, and medications have been entered into the medical record, reviewed, and no changes needed.   Review of Systems: No fevers, chills, night sweats, weight loss, chest pain, or shortness of breath.   Objective:    General: Well Developed, well nourished, and in no acute distress.  Neuro: Alert and oriented x3, extra-ocular muscles intact, sensation grossly intact.  HEENT: Normocephalic, atraumatic, pupils equal round reactive to light, neck supple, no masses, no lymphadenopathy, thyroid nonpalpable.  Skin: Warm and dry, no rashes. Cardiac: Regular rate and rhythm, no murmurs rubs or gallops, no lower extremity edema.  Respiratory: Clear to auscultation bilaterally. Not using accessory muscles, speaking in full sentences. Bilateral Knee: Right knee swollen more so than the left, palpable fluid wave and tenderness at the medial joint lines ROM normal in flexion and extension and lower leg rotation. Ligaments with solid consistent endpoints including ACL, PCL, LCL, MCL. Negative Mcmurray's and provocative meniscal tests. Non painful patellar compression. Patellar and quadriceps tendons unremarkable. Hamstring and quadriceps strength is normal.  Impression and Recommendations:    1. Bilateral knee osteoarthritis, end-stage, Percocet 10/325 mg, every 8 hours, #60. given for pain,  Toradol 30 intramuscular, [facility].   Monica Bectonhomas J , MD 02/14/15 1339  Monica Bectonhomas J , MD 02/14/15 1400

## 2015-02-24 ENCOUNTER — Other Ambulatory Visit: Payer: Self-pay | Admitting: Sports Medicine

## 2015-03-09 ENCOUNTER — Telehealth: Payer: Self-pay | Admitting: Sports Medicine

## 2015-03-09 ENCOUNTER — Other Ambulatory Visit: Payer: Self-pay | Admitting: Sports Medicine

## 2015-03-09 ENCOUNTER — Other Ambulatory Visit: Payer: Self-pay | Admitting: *Deleted

## 2015-03-09 ENCOUNTER — Telehealth: Payer: Self-pay

## 2015-03-09 NOTE — Telephone Encounter (Signed)
Pt's wife called stating that she would like the 02/14/15 ortho referral to [place]. Informed Victorino DikeJennifer and she will make this change.

## 2015-03-09 NOTE — Telephone Encounter (Signed)
Per Karel JarvisEbony- CMA --- Pt wants to be sent [place] so I have faxed referral, notes and ins and contact info and Image report to [facility] [place] and their Phone is [phone] and Fax is [phone]

## 2015-03-18 ENCOUNTER — Telehealth: Payer: Self-pay | Admitting: Sports Medicine

## 2015-03-18 NOTE — Telephone Encounter (Signed)
Pt has been [facility] July 11th at 2:45 and I called patient to make sure he is aware of this appt time

## 2015-03-27 ENCOUNTER — Other Ambulatory Visit: Payer: Self-pay | Admitting: Sports Medicine

## 2015-04-02 ENCOUNTER — Other Ambulatory Visit: Payer: Self-pay | Admitting: Sports Medicine

## 2015-04-22 ENCOUNTER — Other Ambulatory Visit: Payer: Self-pay | Admitting: Sports Medicine

## 2015-04-28 ENCOUNTER — Other Ambulatory Visit: Payer: Self-pay | Admitting: Sports Medicine

## 2015-05-04 ENCOUNTER — Ambulatory Visit (INDEPENDENT_AMBULATORY_CARE_PROVIDER_SITE_OTHER): Payer: BLUE CROSS/BLUE SHIELD | Admitting: Sports Medicine

## 2015-05-04 ENCOUNTER — Encounter: Payer: Self-pay | Admitting: Sports Medicine

## 2015-05-04 VITALS — BP 161/92 | HR 94 | Temp 97.9°F | Wt 312.0 lb

## 2015-05-04 DIAGNOSIS — M25461 Effusion, right knee: Secondary | ICD-10-CM | POA: Diagnosis not present

## 2015-05-04 DIAGNOSIS — L02611 Cutaneous abscess of right foot: Secondary | ICD-10-CM

## 2015-05-04 MED ORDER — OXYCODONE-ACETAMINOPHEN 5-325 MG PO TABS: 1.0000 | ORAL_TABLET | Freq: Three times a day (TID) | ORAL | Status: DC | PRN

## 2015-05-04 NOTE — Progress Notes (Signed)
  Subjective:    CC: Right knee swelling  HPI: Right knee is swollen and painful, he does have bilateral medial and lateral meniscal tears, he is also set up for operative knee arthroscopy tomorrow but the pressure and tension is unbearable. Pain is severe, persistent and he does desire interventional joint aspiration for therapeutic purposes.  Past medical history, Surgical history, Family history not pertinant except as noted below, Social history, Allergies, and medications have been entered into the medical record, reviewed, and no changes needed.   Review of Systems: No fevers, chills, night sweats, weight loss, chest pain, or shortness of breath.   Objective:    General: Well Developed, well nourished, and in no acute distress.  Neuro: Alert and oriented x3, extra-ocular muscles intact, sensation grossly intact.  HEENT: Normocephalic, atraumatic, pupils equal round reactive to light, neck supple, no masses, no lymphadenopathy, thyroid nonpalpable.  Skin: Warm and dry, no rashes. Cardiac: Regular rate and rhythm, no murmurs rubs or gallops, no lower extremity edema.  Respiratory: Clear to auscultation bilaterally. Not using accessory muscles, speaking in full sentences. Right knee: Visible swollen with tenderness at the joint lines and a palpable effusion with fluid wave  Procedure: Real-time Ultrasound Guided aspiration of right knee Device: GE Logiq E  Verbal informed consent obtained.  Time-out conducted.  Noted no overlying erythema, induration, or other signs of local infection.  Skin prepped in a sterile fashion.  Local anesthesia: Topical Ethyl chloride.  With sterile technique and under real time ultrasound guidance:  Using an 18-gauge needle I aspirated 30 mL of straw-colored fluid. Completed without difficulty  Pain immediately resolved suggesting accurate placement of the medication.  Advised to call if fevers/chills, erythema, induration, drainage, or persistent  bleeding.  Images permanently stored and available for review in the ultrasound unit.  Impression: Technically successful ultrasound guided injection.  Impression and Recommendations:

## 2015-05-04 NOTE — Assessment & Plan Note (Signed)
Aspiration as above. Arthroscopy tomorrow.

## 2015-05-17 ENCOUNTER — Other Ambulatory Visit: Payer: Self-pay | Admitting: Sports Medicine

## 2015-05-30 ENCOUNTER — Other Ambulatory Visit: Payer: Self-pay | Admitting: Sports Medicine

## 2015-06-02 ENCOUNTER — Other Ambulatory Visit: Payer: Self-pay | Admitting: Sports Medicine

## 2015-06-04 ENCOUNTER — Other Ambulatory Visit: Payer: Self-pay | Admitting: Sports Medicine

## 2015-06-11 ENCOUNTER — Encounter: Payer: Self-pay | Admitting: Sports Medicine

## 2015-06-11 ENCOUNTER — Ambulatory Visit (INDEPENDENT_AMBULATORY_CARE_PROVIDER_SITE_OTHER): Payer: BLUE CROSS/BLUE SHIELD

## 2015-06-11 ENCOUNTER — Ambulatory Visit (INDEPENDENT_AMBULATORY_CARE_PROVIDER_SITE_OTHER): Payer: BLUE CROSS/BLUE SHIELD | Admitting: Sports Medicine

## 2015-06-11 DIAGNOSIS — F17200 Nicotine dependence, unspecified, uncomplicated: Secondary | ICD-10-CM | POA: Diagnosis not present

## 2015-06-11 DIAGNOSIS — J45909 Unspecified asthma, uncomplicated: Secondary | ICD-10-CM | POA: Diagnosis not present

## 2015-06-11 DIAGNOSIS — J4520 Mild intermittent asthma, uncomplicated: Secondary | ICD-10-CM

## 2015-06-11 MED ORDER — PREDNISONE 50 MG PO TABS: 50.0000 mg | ORAL_TABLET | Freq: Every day | ORAL | Status: DC

## 2015-06-11 MED ORDER — AZITHROMYCIN 250 MG PO TABS: ORAL_TABLET | ORAL | Status: DC

## 2015-06-11 MED ORDER — ALBUTEROL SULFATE (2.5 MG/3ML) 0.083% IN NEBU: 2.5000 mg | INHALATION_SOLUTION | RESPIRATORY_TRACT | Status: DC | PRN

## 2015-06-11 MED ORDER — ALBUTEROL SULFATE 108 (90 BASE) MCG/ACT IN AEPB: 2.0000 | INHALATION_SPRAY | Freq: Four times a day (QID) | RESPIRATORY_TRACT | Status: DC | PRN

## 2015-06-11 NOTE — Progress Notes (Signed)
  Subjective:    CC: Coughing  HPI: Jonathan Villarreal is a pleasant 44 year old male with history of asthma, he comes in with a several-day history of cough, wheeze, shortness of breath, cough is productive. He does have fevers and chills. Symptoms are moderate, persistent, no chest pain. This feels like histoplasma exacerbations and he is out of his albuterol.   Right knee meniscal tear: Now is post-arthroscopic meniscectomy and doing well.  Past medical history, Surgical history, Family history not pertinant except as noted below, Social history, Allergies, and medications have been entered into the medical record, reviewed, and no changes needed.   Review of Systems: No fevers, chills, night sweats, weight loss, chest pain, or shortness of breath.   Objective:    General: Well Developed, well nourished, and in no acute distress.  Neuro: Alert and oriented x3, extra-ocular muscles intact, sensation grossly intact.  HEENT: Normocephalic, atraumatic, pupils equal round reactive to light, neck supple, no masses, no lymphadenopathy, thyroid nonpalpable.  Skin: Warm and dry, no rashes. Cardiac: Regular rate and rhythm, no murmurs rubs or gallops, no lower extremity edema.  Respiratory:  coarse sounds throughout with mild upper lobe expiratory wheezes.  Not using accessory muscles, speaking in full sentences.  Impression and Recommendations:    I spent 25 minutes with this patient, greater than 50% was face-to-face time counseling regarding the above diagnoses

## 2015-06-11 NOTE — Assessment & Plan Note (Signed)
Currently in exacerbation. Albuterol given, prednisone, azithromycin, chest x-ray.

## 2015-06-15 ENCOUNTER — Ambulatory Visit (INDEPENDENT_AMBULATORY_CARE_PROVIDER_SITE_OTHER): Payer: BLUE CROSS/BLUE SHIELD

## 2015-06-15 ENCOUNTER — Encounter: Payer: Self-pay | Admitting: Sports Medicine

## 2015-06-15 ENCOUNTER — Ambulatory Visit (INDEPENDENT_AMBULATORY_CARE_PROVIDER_SITE_OTHER): Payer: BLUE CROSS/BLUE SHIELD | Admitting: Sports Medicine

## 2015-06-15 VITALS — BP 148/92 | HR 85 | Ht 66.0 in | Wt 305.0 lb

## 2015-06-15 DIAGNOSIS — I10 Essential (primary) hypertension: Secondary | ICD-10-CM

## 2015-06-15 DIAGNOSIS — M5416 Radiculopathy, lumbar region: Secondary | ICD-10-CM | POA: Insufficient documentation

## 2015-06-15 DIAGNOSIS — J4520 Mild intermittent asthma, uncomplicated: Secondary | ICD-10-CM

## 2015-06-15 DIAGNOSIS — M4306 Spondylolysis, lumbar region: Secondary | ICD-10-CM | POA: Diagnosis not present

## 2015-06-15 MED ORDER — PREDNISONE 50 MG PO TABS: ORAL_TABLET | ORAL | Status: DC

## 2015-06-15 MED ORDER — GABAPENTIN 300 MG PO CAPS: ORAL_CAPSULE | ORAL | Status: DC

## 2015-06-15 NOTE — Assessment & Plan Note (Signed)
Bilateral L4 paresthesias. X-rays, physical therapy, prednisone, gabapentin. Return in 4 weeks, MRI if no better.

## 2015-06-15 NOTE — Progress Notes (Signed)
  Subjective:    CC: Bilateral leg pain  HPI:  Jonathan Villarreal is post knee arthroscopy for knee osteoarthritis and meniscal tearing, he is however having paresthesias over his anterior lower legs, bilateral. He denies any back pain, symptoms are moderate, persistent. No bowel or bladder dysfunction, saddle numbness, constitutional symptoms. He describes this pain is different from his tubercle osteoarthritis/meniscal pain.   Past medical history, Surgical history, Family history not pertinant except as noted below, Social history, Allergies, and medications have been entered into the medical record, reviewed, and no changes needed.   Review of Systems: No fevers, chills, night sweats, weight loss, chest pain, or shortness of breath.   Objective:    General: Well Developed, well nourished, and in no acute distress.  Neuro: Alert and oriented x3, extra-ocular muscles intact, sensation grossly intact.  HEENT: Normocephalic, atraumatic, pupils equal round reactive to light, neck supple, no masses, no lymphadenopathy, thyroid nonpalpable.  Skin: Warm and dry, no rashes. Cardiac: Regular rate and rhythm, no murmurs rubs or gallops, no lower extremity edema.  Respiratory: Clear to auscultation bilaterally. Not using accessory muscles, speaking in full sentences.  Impression and Recommendations:   I spent 25 minutes with this patient, greater than 50% was face-to-face time counseling regarding the above diagnoses

## 2015-06-22 ENCOUNTER — Ambulatory Visit: Payer: BLUE CROSS/BLUE SHIELD | Admitting: Rehabilitative and Restorative Service Providers"

## 2015-06-24 ENCOUNTER — Other Ambulatory Visit: Payer: Self-pay | Admitting: Sports Medicine

## 2015-06-26 ENCOUNTER — Other Ambulatory Visit: Payer: Self-pay | Admitting: Sports Medicine

## 2015-07-13 ENCOUNTER — Ambulatory Visit: Payer: BLUE CROSS/BLUE SHIELD | Admitting: Sports Medicine

## 2015-07-19 ENCOUNTER — Other Ambulatory Visit: Payer: Self-pay | Admitting: Sports Medicine

## 2015-09-14 ENCOUNTER — Other Ambulatory Visit: Payer: Self-pay | Admitting: Sports Medicine

## 2015-10-02 ENCOUNTER — Other Ambulatory Visit: Payer: Self-pay | Admitting: Sports Medicine

## 2015-10-06 ENCOUNTER — Ambulatory Visit (INDEPENDENT_AMBULATORY_CARE_PROVIDER_SITE_OTHER): Payer: BLUE CROSS/BLUE SHIELD | Admitting: Sports Medicine

## 2015-10-06 VITALS — BP 153/89 | HR 93 | Temp 98.4°F | Resp 18 | Wt 313.8 lb

## 2015-10-06 DIAGNOSIS — E669 Obesity, unspecified: Secondary | ICD-10-CM | POA: Diagnosis not present

## 2015-10-06 DIAGNOSIS — Z87891 Personal history of nicotine dependence: Secondary | ICD-10-CM | POA: Insufficient documentation

## 2015-10-06 DIAGNOSIS — M1712 Unilateral primary osteoarthritis, left knee: Secondary | ICD-10-CM | POA: Diagnosis not present

## 2015-10-06 DIAGNOSIS — Z720 Tobacco use: Secondary | ICD-10-CM | POA: Diagnosis not present

## 2015-10-06 DIAGNOSIS — F17200 Nicotine dependence, unspecified, uncomplicated: Secondary | ICD-10-CM

## 2015-10-06 MED ORDER — PHENTERMINE HCL 37.5 MG PO TABS: ORAL_TABLET | ORAL | Status: DC

## 2015-10-06 MED ORDER — VARENICLINE TARTRATE 0.5 MG X 11 & 1 MG X 42 PO MISC: ORAL | Status: DC

## 2015-10-06 NOTE — Assessment & Plan Note (Signed)
Desires to start Chantix.

## 2015-10-06 NOTE — Assessment & Plan Note (Signed)
Starting phentermine, referral to nutrition, return monthly.

## 2015-10-06 NOTE — Progress Notes (Signed)
  Subjective:    CC:  Follow-up  HPI: Right knee osteoarthritis: Post arthroscopy, with recurrent effusions, pain. Predominantly medial joint line and requesting interventional treatment today.  Obesity: Agrees to start weight loss treatment included phentermine and nutrition visits.  Smoker: Would like to start Chantix.  Past medical history, Surgical history, Family history not pertinant except as noted below, Social history, Allergies, and medications have been entered into the medical record, reviewed, and no changes needed.   Review of Systems: No fevers, chills, night sweats, weight loss, chest pain, or shortness of breath.   Objective:    General: Well Developed, well nourished, and in no acute distress.  Neuro: Alert and oriented x3, extra-ocular muscles intact, sensation grossly intact.  HEENT: Normocephalic, atraumatic, pupils equal round reactive to light, neck supple, no masses, no lymphadenopathy, thyroid nonpalpable.  Skin: Warm and dry, no rashes. Cardiac: Regular rate and rhythm, no murmurs rubs or gallops, no lower extremity edema.  Respiratory: Clear to auscultation bilaterally. Not using accessory muscles, speaking in full sentences. Right Knee: visibly swollen with a palpable effusion and tenderness at the medial joint line ROM normal in flexion and extension and lower leg rotation. Ligaments with solid consistent endpoints including ACL, PCL, LCL, MCL. Negative Mcmurray's and provocative meniscal tests. Non painful patellar compression. Patellar and quadriceps tendons unremarkable. Hamstring and quadriceps strength is normal.  Procedure: Real-time Ultrasound Guided aspiration/Injection of right knee Device: GE Logiq E  Verbal informed consent obtained.  Time-out conducted.  Noted no overlying erythema, induration, or other signs of local infection.  Skin prepped in a sterile fashion.  Local anesthesia: Topical Ethyl chloride.  With sterile technique and  under real time ultrasound guidance:  28 mL straw-colored fluid aspirated. 18-gauge needle, syringe switched and 2 mL kenalog 40, 2 mL lidocaine, 2 mL Marcaine injected easily. Completed without difficulty  Pain immediately resolved suggesting accurate placement of the medication.  Advised to call if fevers/chills, erythema, induration, drainage, or persistent bleeding.  Images permanently stored and available for review in the ultrasound unit.  Impression: Technically successful ultrasound guided injection.  Impression and Recommendations:

## 2015-10-06 NOTE — Assessment & Plan Note (Addendum)
Aspiration and injection, he is postarthroscopy. Persistent pain and swelling. Referral to Dr. Corinna Capra.

## 2015-11-03 ENCOUNTER — Encounter: Payer: Self-pay | Admitting: Sports Medicine

## 2015-11-03 ENCOUNTER — Ambulatory Visit (INDEPENDENT_AMBULATORY_CARE_PROVIDER_SITE_OTHER): Payer: BLUE CROSS/BLUE SHIELD | Admitting: Sports Medicine

## 2015-11-03 ENCOUNTER — Other Ambulatory Visit: Payer: Self-pay | Admitting: Sports Medicine

## 2015-11-03 VITALS — BP 136/87 | HR 87 | Resp 18 | Wt 311.9 lb

## 2015-11-03 DIAGNOSIS — E669 Obesity, unspecified: Secondary | ICD-10-CM | POA: Diagnosis not present

## 2015-11-03 DIAGNOSIS — M25461 Effusion, right knee: Secondary | ICD-10-CM | POA: Diagnosis not present

## 2015-11-03 DIAGNOSIS — F17200 Nicotine dependence, unspecified, uncomplicated: Secondary | ICD-10-CM

## 2015-11-03 DIAGNOSIS — J4520 Mild intermittent asthma, uncomplicated: Secondary | ICD-10-CM | POA: Diagnosis not present

## 2015-11-03 DIAGNOSIS — Z720 Tobacco use: Secondary | ICD-10-CM

## 2015-11-03 MED ORDER — ALBUTEROL SULFATE 108 (90 BASE) MCG/ACT IN AEPB: 2.0000 | INHALATION_SPRAY | Freq: Four times a day (QID) | RESPIRATORY_TRACT | Status: DC | PRN

## 2015-11-03 MED ORDER — ALBUTEROL SULFATE (2.5 MG/3ML) 0.083% IN NEBU: 2.5000 mg | INHALATION_SOLUTION | RESPIRATORY_TRACT | Status: DC | PRN

## 2015-11-03 MED ORDER — PHENTERMINE HCL 37.5 MG PO TABS: ORAL_TABLET | ORAL | Status: DC

## 2015-11-03 MED ORDER — TOPIRAMATE 50 MG PO TABS: ORAL_TABLET | ORAL | Status: DC

## 2015-11-03 NOTE — Progress Notes (Signed)
  Subjective:    CC: Follow-up  HPI: Obesity: 2 pound weight loss on phentermine  Smoker: Has quit with Chantix  Hypertension: Controlled  Asthma: Well controlled, needs a refill.  Past medical history, Surgical history, Family history not pertinant except as noted below, Social history, Allergies, and medications have been entered into the medical record, reviewed, and no changes needed.   Review of Systems: No fevers, chills, night sweats, weight loss, chest pain, or shortness of breath.   Objective:    General: Well Developed, well nourished, and in no acute distress.  Neuro: Alert and oriented x3, extra-ocular muscles intact, sensation grossly intact.  HEENT: Normocephalic, atraumatic, pupils equal round reactive to light, neck supple, no masses, no lymphadenopathy, thyroid nonpalpable.  Skin: Warm and dry, no rashes. Cardiac: Regular rate and rhythm, no murmurs rubs or gallops, no lower extremity edema.  Respiratory: Clear to auscultation bilaterally. Not using accessory muscles, speaking in full sentences.  Impression and Recommendations:

## 2015-11-03 NOTE — Assessment & Plan Note (Signed)
Only a 2 pound weight loss after the first month on phentermine, adding Topamax, return in one month.

## 2015-11-03 NOTE — Assessment & Plan Note (Signed)
Resolved after aspiration and injection at the last visit. °

## 2015-11-03 NOTE — Assessment & Plan Note (Signed)
Well-controlled, needs a refill on inhalers

## 2015-11-03 NOTE — Assessment & Plan Note (Signed)
Has quit smoking.  

## 2015-11-18 ENCOUNTER — Other Ambulatory Visit: Payer: Self-pay | Admitting: Sports Medicine

## 2015-12-01 ENCOUNTER — Ambulatory Visit: Payer: BLUE CROSS/BLUE SHIELD | Admitting: Sports Medicine

## 2016-01-21 ENCOUNTER — Other Ambulatory Visit: Payer: Self-pay | Admitting: Sports Medicine

## 2016-02-05 ENCOUNTER — Other Ambulatory Visit: Payer: Self-pay | Admitting: Sports Medicine

## 2016-04-19 ENCOUNTER — Ambulatory Visit: Payer: BLUE CROSS/BLUE SHIELD

## 2016-04-19 ENCOUNTER — Other Ambulatory Visit: Payer: BLUE CROSS/BLUE SHIELD

## 2016-04-19 ENCOUNTER — Other Ambulatory Visit: Payer: Self-pay

## 2016-04-19 DIAGNOSIS — Z20820 Contact with and (suspected) exposure to varicella: Secondary | ICD-10-CM

## 2016-04-19 DIAGNOSIS — Z9229 Personal history of other drug therapy: Secondary | ICD-10-CM

## 2016-04-20 LAB — MEASLES/MUMPS/RUBELLA IMMUNITY
Mumps IgG: <9 AU/mL (ref <9.00)
Rubella: 23.7 Index — ABNORMAL HIGH (ref <0.90)
Rubeola IgG: 34.6 [AU]/ml — ABNORMAL HIGH (ref <25.00)

## 2016-04-20 LAB — VARICELLA ZOSTER ANTIBODY, IGG: Varicella IgG: 475.2 Index — ABNORMAL HIGH (ref <135.00)

## 2016-04-21 ENCOUNTER — Ambulatory Visit (INDEPENDENT_AMBULATORY_CARE_PROVIDER_SITE_OTHER): Payer: BLUE CROSS/BLUE SHIELD | Admitting: Sports Medicine

## 2016-04-21 VITALS — BP 122/79 | HR 75 | Temp 98.2°F | Wt 310.0 lb

## 2016-04-21 DIAGNOSIS — Z23 Encounter for immunization: Secondary | ICD-10-CM | POA: Diagnosis not present

## 2016-04-21 NOTE — Progress Notes (Signed)
Patient walked into clinic this morning regarding his lab results. Blood work shows Pt is not immune to mumps and will need to get the MMR vaccine series. 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 Pt tolerated MMR immunization injection in right deltoid well, no immediate complications. Updated immunization report given to Pt so he can have proof for employer that he has begun the series. Pt advised to schedule an appt in 28 days for second and final MMR immunization. Verbalized understanding, no further questions.

## 2016-05-19 ENCOUNTER — Ambulatory Visit: Payer: BLUE CROSS/BLUE SHIELD

## 2016-05-31 IMAGING — CR DG LUMBAR SPINE COMPLETE 4+V
5 series · 5 of 5 positions shown · non-contrast
Comparison: None.

CLINICAL DATA: Bilateral low back pain for the past 2 months.

EXAM:
LUMBAR SPINE - COMPLETE 4+ VIEW

[l-spine ap]
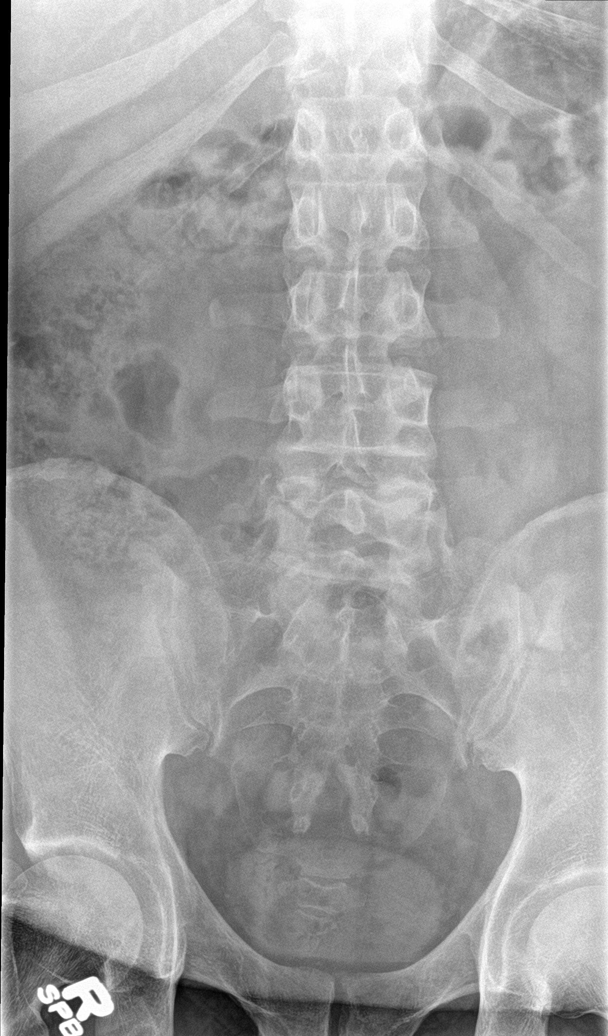

[l-spine obl (1 of 2)]
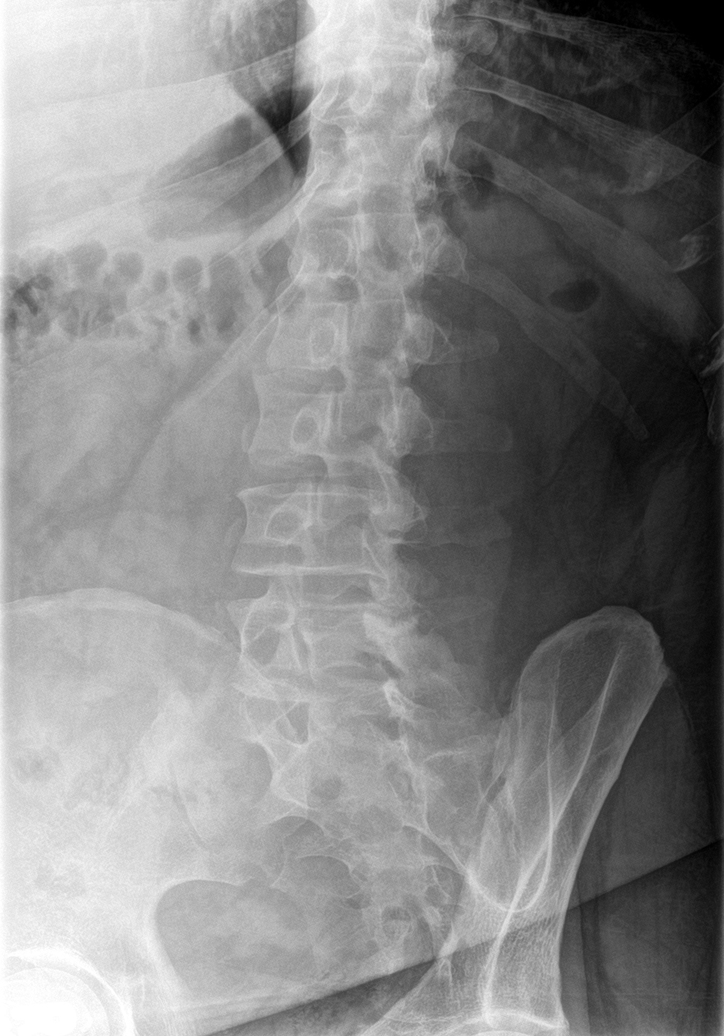

[l-spine obl (2 of 2)]
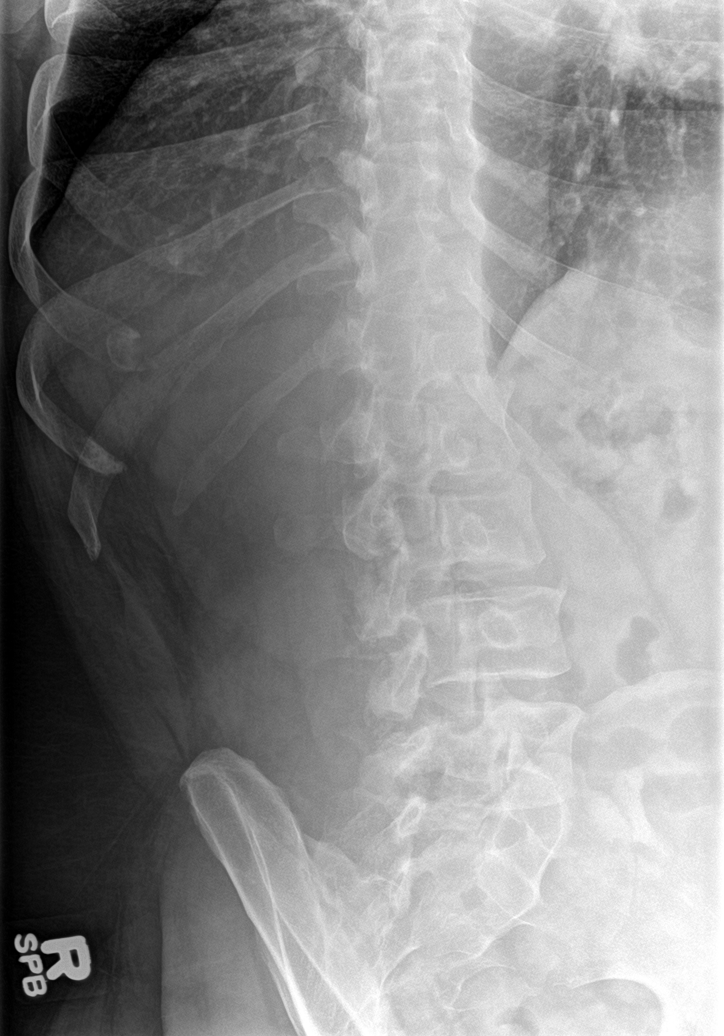

[l-spine lat]
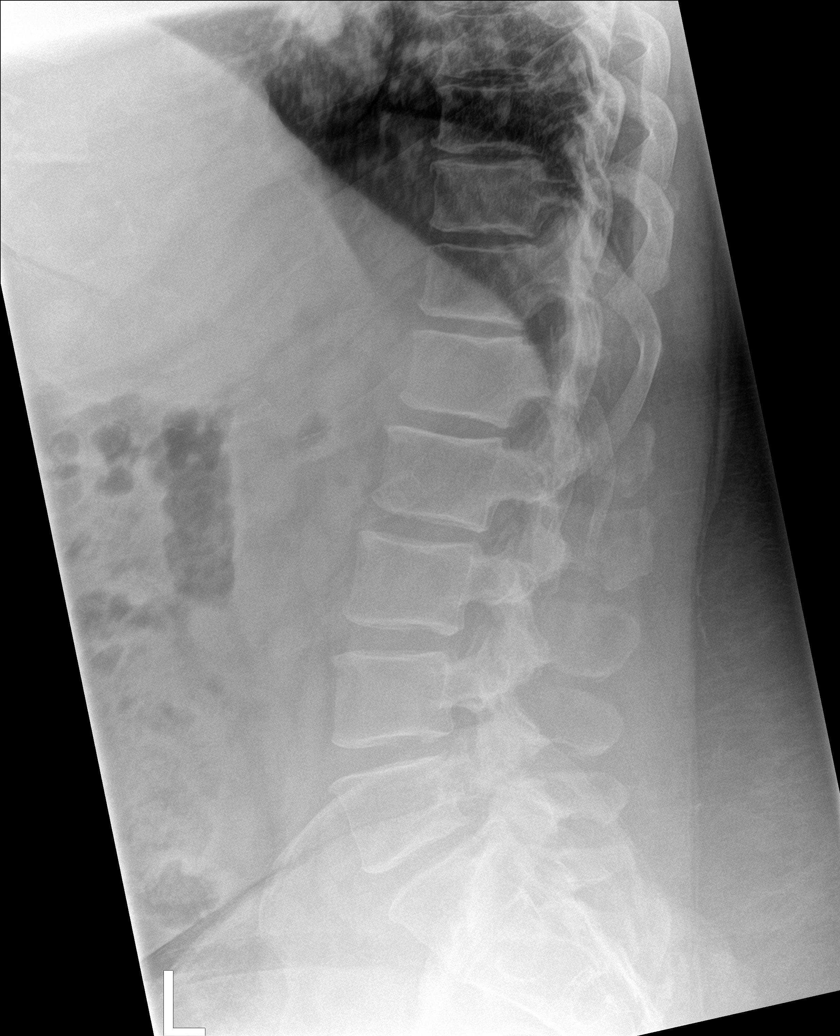

[l-spine spot]
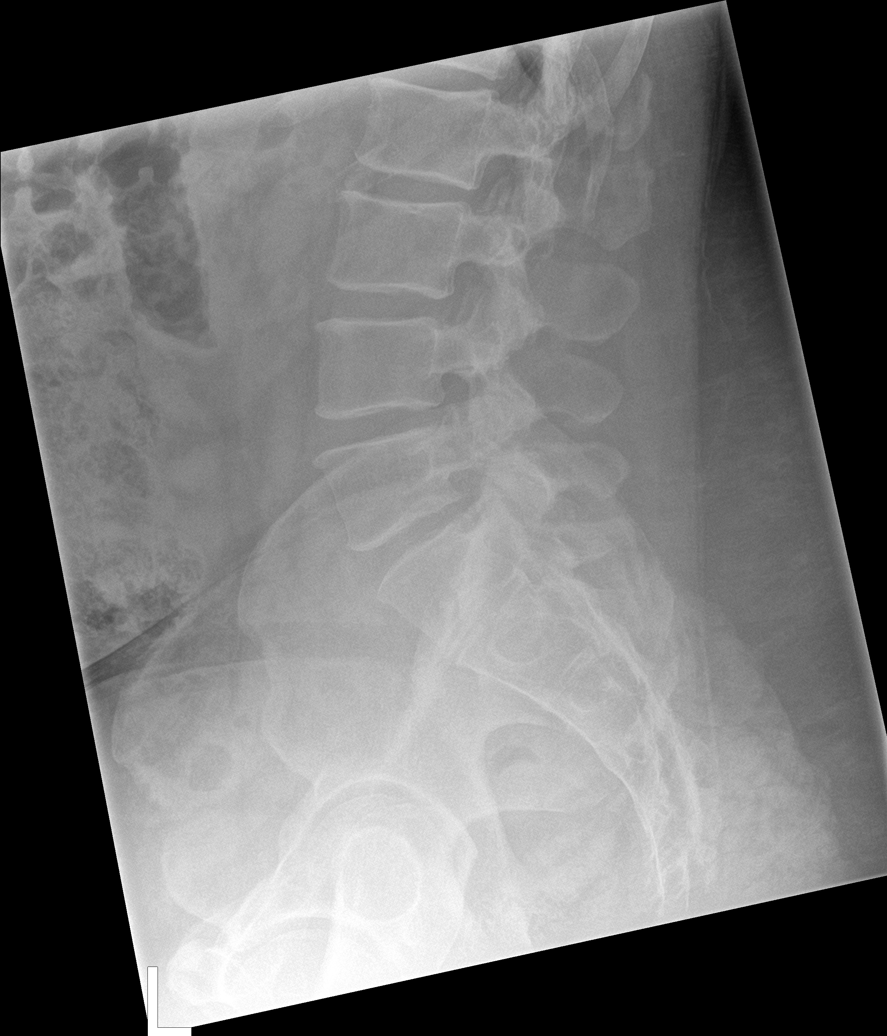

[5 of 5 positions shown; findings below may reference images not displayed]

FINDINGS: There are 4 non-rib-bearing lumbar vertebrae. There are mildly
transitional thoracolumbar and lumbosacral vertebrae. For counting
purposes, the last open disc space is labeled the L5-S1 level.
Bilateral L5 pars interarticularis defects are noted. Normal
alignment. No acute fractures. Mild anterior spur formation at
multiple levels of the lumbar and lower thoracic spine.
IMPRESSION: 1. Bilateral L5 spondylolysis without spondylolisthesis.
2. Mild degenerative changes.

## 2016-12-07 ENCOUNTER — Ambulatory Visit: Payer: BLUE CROSS/BLUE SHIELD | Admitting: Sports Medicine

## 2017-01-04 ENCOUNTER — Ambulatory Visit (INDEPENDENT_AMBULATORY_CARE_PROVIDER_SITE_OTHER): Payer: BLUE CROSS/BLUE SHIELD

## 2017-01-04 ENCOUNTER — Encounter: Payer: Self-pay | Admitting: Family Medicine

## 2017-01-04 ENCOUNTER — Ambulatory Visit (INDEPENDENT_AMBULATORY_CARE_PROVIDER_SITE_OTHER): Payer: BLUE CROSS/BLUE SHIELD | Admitting: Family Medicine

## 2017-01-04 VITALS — BP 130/91 | HR 72 | Wt 306.0 lb

## 2017-01-04 DIAGNOSIS — M810 Age-related osteoporosis without current pathological fracture: Secondary | ICD-10-CM | POA: Diagnosis not present

## 2017-01-04 DIAGNOSIS — I10 Essential (primary) hypertension: Secondary | ICD-10-CM | POA: Diagnosis not present

## 2017-01-04 DIAGNOSIS — M170 Bilateral primary osteoarthritis of knee: Secondary | ICD-10-CM

## 2017-01-04 DIAGNOSIS — M76892 Other specified enthesopathies of left lower limb, excluding foot: Secondary | ICD-10-CM

## 2017-01-04 DIAGNOSIS — M7989 Other specified soft tissue disorders: Secondary | ICD-10-CM | POA: Diagnosis not present

## 2017-01-04 LAB — BASIC METABOLIC PANEL WITH GFR
BUN: 10 mg/dL (ref 7–25)
CHLORIDE: 103 mmol/L (ref 98–110)
CO2: 21 mmol/L (ref 20–31)
CREATININE: 0.73 mg/dL (ref 0.60–1.35)
Calcium: 9.4 mg/dL (ref 8.6–10.3)
GFR, Est African American: >89 mL/min (ref >=60)
Glucose, Bld: 85 mg/dL (ref 65–99)
Potassium: 4.8 mmol/L (ref 3.5–5.3)
SODIUM: 137 mmol/L (ref 135–146)

## 2017-01-04 LAB — URIC ACID: Uric Acid, Serum: 5.6 mg/dL (ref 4.0–8.0)

## 2017-01-04 MED ORDER — INDOMETHACIN 50 MG PO CAPS: 50.0000 mg | ORAL_CAPSULE | Freq: Two times a day (BID) | ORAL | 1 refills | Status: DC

## 2017-01-04 MED ORDER — TRAMADOL HCL 50 MG PO TABS: ORAL_TABLET | ORAL | 1 refills | Status: DC

## 2017-01-04 MED ORDER — HYDROCHLOROTHIAZIDE 25 MG PO TABS: ORAL_TABLET | ORAL | 1 refills | Status: DC

## 2017-01-04 NOTE — Progress Notes (Signed)
Subjective:    Patient ID: Jonathan Villarreal, male    DOB: 1971-01-19, 46 y.o.   MRN: [id number]  HPI 46 year old male comes in today for bilateral knee pain. He lost his health insurance 4. Time but now has it back and would like to get back in to discuss pain management. He was previously seeing pain management and was treated with oxycodone and tramadol. Since he lost his insurance he's just been using over-the-counter medications. He was also on gabapentin at one point in time. He says he was told that at some point he would need knee replacement. He is Artie had arthroscopy on his right knee and unfortunately gets recurrent effusions. He also reports occasionally that his feet swell. 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   Hypertension- Pt denies chest pain, SOB, dizziness, or heart palpitations.  Taking meds as directed w/o problems.  Denies medication side effects.      Review of Systems  BP (!) 130/91   Pulse 72   Wt (!) 306 lb (138.8 kg)   BMI 49.39 kg/m     Allergies  Allergen Reactions  . Bee Venom Anaphylaxis    Past Medical History:  Diagnosis Date  . Asthma     Past Surgical History:  Procedure Laterality Date  . APPENDECTOMY      Social History   Social History  . Marital status: Married    Spouse name: N/A  . Number of children: N/A  . Years of education: N/A   Occupational History  . Not on file.   Social History Main Topics  . Smoking status: Former Smoker    Packs/day: 0.50    Types: Cigarettes  . Smokeless tobacco: Never Used  . Alcohol use 0.0 oz/week  . Drug use: No  . Sexual activity: Not on file   Other Topics Concern  . Not on file   Social History Narrative  . No narrative on file    Family History  Problem Relation Age of Onset  . Diabetes  Mother   . Hypertension Father     Outpatient Encounter Prescriptions as of 01/04/2017  Medication Sig  . albuterol (PROVENTIL) (2.5 MG/3ML) 0.083% nebulizer solution INHALE 1 VIAL NEBULIZER EVERY 6 HOURS AS NEEDED FOR WHEEZING OR SHORTNESS OF BREATH (Patient not taking: Reported on 01/04/2017)  . albuterol (PROVENTIL) (2.5 MG/3ML) 0.083% nebulizer solution INHALE 1 VIAL NEBULIZER EVERY 6 HOURS AS NEEDED FOR WHEEZING OR SHORTNESS OF BREATH (Patient not taking: Reported on 01/04/2017)  . Albuterol Sulfate (PROAIR RESPICLICK) 108 (90 Base) MCG/ACT AEPB Inhale 2 puffs into the lungs every 6 (six) hours as needed (cough, wheeze, SOB). (Patient not taking: Reported on 01/04/2017)  . gabapentin (NEURONTIN) 300 MG capsule One tab PO qHS for a week, then BID for a week, then TID. May double weekly to a max of 3,600mg /day (Patient not taking: Reported on 01/04/2017)  . hydrochlorothiazide (HYDRODIURIL) 25 MG tablet TAKE 1/2 OF A TABLET BY MOUTH EVERY DAY  . indomethacin (INDOCIN) 50 MG capsule Take 1 capsule (50 mg total) by mouth 2 (two) times daily with a meal.  . oxyCODONE-acetaminophen (PERCOCET/ROXICET) 5-325 MG per tablet Take 1 tablet by mouth every 8 (eight) hours as needed. (Patient not taking: Reported on 01/04/2017)  . traMADol (ULTRAM) 50  MG tablet TAKE 1 TABLETS 3 TIMES A DAY PRN Pain  . [DISCONTINUED] hydrochlorothiazide (HYDRODIURIL) 25 MG tablet TAKE 1/2 OF A TABLET BY MOUTH EVERY DAY (Patient not taking: Reported on 01/04/2017)  . [DISCONTINUED] meloxicam (MOBIC) 15 MG tablet Take 1 tablet (15 mg total) by mouth daily. Take with food each morning (Patient not taking: Reported on 01/04/2017)  . [DISCONTINUED] traMADol (ULTRAM) 50 MG tablet TAKE 2 TABLETS 3 TIMES A DAY (Patient not taking: Reported on 01/04/2017)   No facility-administered encounter medications on file as of 01/04/2017.          Objective:   Physical Exam  Constitutional: He is oriented to person, place, and time. He appears  well-developed and well-nourished.  HENT:  Head: Normocephalic and atraumatic.  Cardiovascular: Normal rate, regular rhythm and normal heart sounds.   Pulmonary/Chest: Effort normal and breath sounds normal.  Musculoskeletal:  Right knee is tender along the medial and lateral joint lines. Also tender at the patellar tendon. Strength is 5 out of 5 at the hips knees and ankles bilaterally. Left knee is tender medially and over the patellar tendon. He has crepitus with both knees. Reflexes 1+ bilaterally.  Neurological: He is alert and oriented to person, place, and time.  Skin: Skin is warm and dry.  Psychiatric: He has a normal mood and affect. His behavior is normal.        Assessment & Plan:  Bilat knee OA - We'll go ahead and restart tramadol. He would like to try indomethacin. He's been taking over-the-counter Aleve and [facility] arthritis pain reliever. He says his brother uses indomethacin and it works well so he would like to try it. Perception sent for 30 days. Do think you should also be checked for gout at some point associate with history of feet swelling. Do not see a uric acid on file. He can either check that today or at his follow-up for physical. The go ahead and place the pain management referral since he was previous on chronic narcotics for this. And eventually in the long-term he needs to consider knee replacement if that is what is recommended. Significant weight loss would help as well. He is Artie lost about 30 pounds per his wife is here with him today.  HTN - dated today. Restart hydrochlorothiazide. New perception sent to pharmacy

## 2017-01-05 NOTE — Progress Notes (Signed)
All labs are normal. 

## 2017-02-15 ENCOUNTER — Ambulatory Visit (INDEPENDENT_AMBULATORY_CARE_PROVIDER_SITE_OTHER): Payer: BLUE CROSS/BLUE SHIELD | Admitting: Sports Medicine

## 2017-02-15 ENCOUNTER — Encounter: Payer: Self-pay | Admitting: Sports Medicine

## 2017-02-15 VITALS — BP 131/84 | HR 79 | Resp 18 | Ht 66.0 in | Wt 303.4 lb

## 2017-02-15 DIAGNOSIS — E6609 Other obesity due to excess calories: Secondary | ICD-10-CM | POA: Diagnosis not present

## 2017-02-15 DIAGNOSIS — Z0000 Encounter for general adult medical examination without abnormal findings: Secondary | ICD-10-CM

## 2017-02-15 DIAGNOSIS — M170 Bilateral primary osteoarthritis of knee: Secondary | ICD-10-CM

## 2017-02-15 DIAGNOSIS — I10 Essential (primary) hypertension: Secondary | ICD-10-CM

## 2017-02-15 DIAGNOSIS — Z87891 Personal history of nicotine dependence: Secondary | ICD-10-CM | POA: Diagnosis not present

## 2017-02-15 MED ORDER — INDOMETHACIN 50 MG PO CAPS: 50.0000 mg | ORAL_CAPSULE | Freq: Every day | ORAL | 3 refills | Status: DC

## 2017-02-15 MED ORDER — TRAMADOL HCL ER 200 MG PO TB24: 200.0000 mg | ORAL_TABLET | Freq: Every day | ORAL | 3 refills | Status: DC

## 2017-02-15 MED ORDER — PHENTERMINE HCL 37.5 MG PO TABS: ORAL_TABLET | ORAL | 0 refills | Status: DC

## 2017-02-15 NOTE — Assessment & Plan Note (Signed)
Checking routine bloodwork. 

## 2017-02-15 NOTE — Assessment & Plan Note (Signed)
Congratulated on quitting 

## 2017-02-15 NOTE — Assessment & Plan Note (Signed)
Controlled, no changes. 

## 2017-02-15 NOTE — Assessment & Plan Note (Signed)
Restarting phentermine, return monthly for weight checks and refills. 

## 2017-02-15 NOTE — Progress Notes (Signed)
  Subjective:    CC: Annual physical  HPI:  Jonathan Villarreal was here for his physical, he is not fasting. He has no complaints regarding his health, he has stopped smoking.  Hypertension: Controlled  Bilateral knee osteoarthritis: Uric acid levels were normal, indomethacin has been somewhat helpful, he still wakes up with significant morning stiffness.  Obesity: Would like to restart phentermine.  Past medical history:  Negative.  See flowsheet/record as well for more information.  Surgical history: Negative.  See flowsheet/record as well for more information.  Family history: Negative.  See flowsheet/record as well for more information.  Social history: Negative.  See flowsheet/record as well for more information.  Allergies, and medications have been entered into the medical record, reviewed, and no changes needed.    Review of Systems: No headache, visual changes, nausea, vomiting, diarrhea, constipation, dizziness, abdominal pain, skin rash, fevers, chills, night sweats, swollen lymph nodes, weight loss, chest pain, body aches, joint swelling, muscle aches, shortness of breath, mood changes, visual or auditory hallucinations.  Objective:    General: Well Developed, well nourished, and in no acute distress.  Neuro: Alert and oriented x3, extra-ocular muscles intact, sensation grossly intact. Cranial nerves II through XII are intact, motor, sensory, and coordinative functions are all intact. HEENT: Normocephalic, atraumatic, pupils equal round reactive to light, neck supple, no masses, no lymphadenopathy, thyroid nonpalpable. Oropharynx, nasopharynx, external ear canals are unremarkable. Skin: Warm and dry, no rashes noted.  Cardiac: Regular rate and rhythm, no murmurs rubs or gallops.  Respiratory: Clear to auscultation bilaterally. Not using accessory muscles, speaking in full sentences.  Abdominal: Soft, nontender, nondistended, positive bowel sounds, no masses, no organomegaly.    Musculoskeletal: Shoulder, elbow, wrist, hip, knee, ankle stable, and with full range of motion.  Impression and Recommendations:    The patient was counselled, risk factors were discussed, anticipatory guidance given.  Annual physical exam Checking routine blood work.  Primary osteoarthritis of both knees Has not responded to steroid injections or Viscosupplementation. He does need an arthroplasty, he needs his wife to get this done first so that he can take care of her for he has 1. I'm going to switch him to extended release 200 mg tramadol and he will try playing around with the timing of his indomethacin, he's going to try to night since he has significant morning stiffness.  Essential hypertension, benign Controlled, no changes.  Obesity Restarting phentermine, return monthly for weight checks and refills.  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

## 2017-02-15 NOTE — Assessment & Plan Note (Addendum)
Has not responded to steroid injections or Viscosupplementation. He does need an arthroplasty, he needs his wife to get this done first so that he can take care of her for he has 1. I'm going to switch him to extended release 200 mg tramadol and he will try playing around with the timing of his indomethacin, he's going to try to night since he has significant morning stiffness.

## 2017-02-16 ENCOUNTER — Telehealth: Payer: Self-pay | Admitting: *Deleted

## 2017-02-16 NOTE — Telephone Encounter (Signed)
Pre Authorization sent to cover my meds.[id number]

## 2017-02-20 NOTE — Telephone Encounter (Signed)
PA denied because they state because patient not being treated for chronic non cancer pain that requires round the clock long term opiod treatment when of other forms of treatment have not worked. Appeal sent   Appeal was denied

## 2017-02-26 ENCOUNTER — Other Ambulatory Visit: Payer: Self-pay | Admitting: Family Medicine

## 2017-02-27 ENCOUNTER — Other Ambulatory Visit: Payer: Self-pay | Admitting: Family Medicine

## 2017-02-27 ENCOUNTER — Encounter: Payer: Self-pay | Admitting: Sports Medicine

## 2017-02-27 MED ORDER — TRAMADOL HCL 50 MG PO TABS: 100.0000 mg | ORAL_TABLET | Freq: Two times a day (BID) | ORAL | 0 refills | Status: DC

## 2017-03-14 NOTE — Telephone Encounter (Signed)
PA denied for a second time.  This will be need to be appealed.  Forms are getting faxed over. They will also need patient signature before being faxed back. -EH/RMA

## 2017-03-14 NOTE — Telephone Encounter (Signed)
Received appeal forms.  Left vm to notifying pt that there is a part of the form he has to fill out and sign before we fax it back. -EH/RMA

## 2017-04-06 ENCOUNTER — Other Ambulatory Visit: Payer: Self-pay | Admitting: Family Medicine

## 2017-04-14 ENCOUNTER — Emergency Department
Admission: EM | Admit: 2017-04-14 | Discharge: 2017-04-14 | Disposition: A | Discharge status: Home / Self Care | Payer: BLUE CROSS/BLUE SHIELD | Source: Home / Self Care | Attending: Family Medicine | Admitting: Family Medicine

## 2017-04-14 ENCOUNTER — Encounter: Payer: Self-pay | Admitting: Emergency Medicine

## 2017-04-14 DIAGNOSIS — L600 Ingrowing nail: Secondary | ICD-10-CM | POA: Diagnosis not present

## 2017-04-14 MED ORDER — HYDROCODONE-ACETAMINOPHEN 5-325 MG PO TABS: 1.0000 | ORAL_TABLET | Freq: Four times a day (QID) | ORAL | 0 refills | Status: DC | PRN

## 2017-04-14 MED ORDER — DOXYCYCLINE HYCLATE 100 MG PO CAPS: 100.0000 mg | ORAL_CAPSULE | Freq: Two times a day (BID) | ORAL | 0 refills | Status: DC

## 2017-04-14 NOTE — ED Triage Notes (Signed)
Left great toe ingrown, painful and swollen x 1 month

## 2017-04-14 NOTE — Discharge Instructions (Signed)
Elevate foot as much as possible for 2 to 3 days.  Leave present bandage in place for 24 hours, then change daily until healed.  May take Ibuprofen 200mg , 4 tabs every 8 hours with food.

## 2017-04-14 NOTE — ED Provider Notes (Signed)
[facility]    CSN: [id number] Arrival date & time: 04/14/17  1753     History   Chief Complaint Chief Complaint  Patient presents with  . Ingrown Toenail    HPI Jonathan Villarreal is a 46 y.o. male.   Patient complains of a painful ingrown left great toenail for about one month, recently with increasing purulent drainage.  No fevers, chills, and sweats.   The history is provided by the patient and the spouse.    Past Medical History:  Diagnosis Date  . Asthma     Patient Active Problem List   Diagnosis Date Noted  . Annual physical exam 02/15/2017  . Former smoker 10/06/2015  . Bilateral lumbar radiculopathy 06/15/2015  . Asthma, mild intermittent 04/17/2014  . Essential hypertension, benign 02/13/2014  . Primary osteoarthritis of both knees 11/21/2013  . Obesity 11/21/2013    Past Surgical History:  Procedure Laterality Date  . APPENDECTOMY         Home Medications    Prior to Admission medications   Medication Sig Start Date End Date Taking? Authorizing Provider  Albuterol Sulfate (PROAIR RESPICLICK) 108 (90 Base) MCG/ACT AEPB Inhale 2 puffs into the lungs every 6 (six) hours as needed (cough, wheeze, SOB). Patient not taking: Reported on 01/04/2017 11/03/15   Monica Bectonhekkekandam, Thomas J, MD  doxycycline (VIBRAMYCIN) 100 MG capsule Take 1 capsule (100 mg total) by mouth 2 (two) times daily. Take with food. 04/14/17   Lattie HawBeese, Stephen A, MD  gabapentin (NEURONTIN) 300 MG capsule One tab PO qHS for a week, then BID for a week, then TID. May double weekly to a max of 3,600mg /day Patient not taking: Reported on 01/04/2017 06/15/15   Monica Bectonhekkekandam, Thomas J, MD  hydrochlorothiazide (HYDRODIURIL) 25 MG tablet TAKE 1/2 OF A TABLET BY MOUTH EVERY DAY 01/04/17   Agapito GamesMetheney, Catherine D, MD  HYDROcodone-acetaminophen (NORCO/VICODIN) 5-325 MG tablet Take 1 tablet by mouth every 6 (six) hours as needed for moderate pain. 04/14/17   Lattie HawBeese, Stephen A, MD  indomethacin (INDOCIN) 50 MG  capsule Take 1 capsule (50 mg total) by mouth at bedtime. 02/15/17   Monica Bectonhekkekandam, Thomas J, MD  phentermine (ADIPEX-P) 37.5 MG tablet One tab by mouth qAM 02/15/17   Monica Bectonhekkekandam, Thomas J, MD  traMADol (ULTRAM) 50 MG tablet TAKE 1 TABLET BY MOUTH THREE TIMES DAILY AS NEEDED FOR PAIN 04/06/17   Monica Bectonhekkekandam, Thomas J, MD    Family History Family History  Problem Relation Age of Onset  . Diabetes Mother   . Hypertension Father     Social History Social History  Substance Use Topics  . Smoking status: Former Smoker    Packs/day: 0.50    Types: Cigarettes  . Smokeless tobacco: Never Used  . Alcohol use 0.0 oz/week     Allergies   Bee venom   Review of Systems Review of Systems  Constitutional: Negative for activity change, appetite change, chills, diaphoresis, fatigue and fever.  Musculoskeletal:       Left great toe pain.  Skin: Positive for color change.  All other systems reviewed and are negative.    Physical Exam Triage Vital Signs ED Triage Vitals  Enc Vitals Group     BP 04/14/17 1817 129/83     Pulse Rate 04/14/17 1817 85     Resp --      Temp 04/14/17 1817 98.7 F (37.1 C)     Temp Source 04/14/17 1817 Oral     SpO2 04/14/17 1817 96 %  Weight 04/14/17 1818 298 lb (135.2 kg)     Height 04/14/17 1818 5\' 6"  (1.676 m)     Head Circumference --      Peak Flow --      Pain Score 04/14/17 1819 6     Pain Loc --      Pain Edu? --      Excl. in GC? --    No data found.   Updated Vital Signs BP 129/83 (BP Location: Left Arm)   Pulse 85   Temp 98.7 F (37.1 C) (Oral)   Ht 5\' 6"  (1.676 m)   Wt 298 lb (135.2 kg)   SpO2 96%   BMI 48.10 kg/m   Visual Acuity Right Eye Distance:   Left Eye Distance:   Bilateral Distance:    Right Eye Near:   Left Eye Near:    Bilateral Near:     Physical Exam  Constitutional: He appears well-developed and well-nourished. No distress.  HENT:  Head: Normocephalic.  Eyes: Pupils are equal, round, and reactive to  light.  Cardiovascular: Normal rate.   Pulmonary/Chest: Effort normal.  Musculoskeletal: He exhibits no edema.       Feet:  Lateral edge of left great toe swollen, tender, and erythematous.  There is granulation tissue overlying lateral edge of toenail with a small amount of purulent drainage present.  Neurological: He is alert.  Skin: Skin is warm and dry.  Nursing note and vitals reviewed.    UC Treatments / Results  Labs (all labs ordered are listed, but only abnormal results are displayed) Labs Reviewed  WOUND CULTURE    EKG  EKG Interpretation None       Radiology No results found.  Procedures Procedures Partial toenail excision Explained benefits and risks of procedure and consent obtained.  With sterile technique and digital plain 0.25% bupivacaine anesthesia, resected approximately 5mm segment of lateral edge of left great toenail without difficulty.  Cauterized base of exposed nail bed with silver nitrate.  Bandaged with Xeroform gauze, followed by light dressing with Kerlix gauze wrap and secured with CoBan wrap.  Wound precautions given.  Begin antibiotic.    Medications Ordered in UC Medications - No data to display   Initial Impression / Assessment and Plan / UC Course  I have reviewed the triage vital signs and the nursing notes.  Pertinent labs & imaging results that were available during my care of the patient were reviewed by me and considered in my medical decision making (see chart for details).    Wound culture pending.  Begin empiric doxycycline 100mg  BID. Rx for Lortab (#10, no refill).  Controlled Substance Prescriptions I have consulted the [place] Controlled Substances Registry for this patient, and feel the risk/benefit ratio today is favorable for proceeding with this prescription for a controlled substance.   Elevate foot as much as possible for 2 to 3 days.  Leave present bandage in place for 24 hours, then change daily until healed.  May take  Ibuprofen 200mg , 4 tabs every 8 hours with food.  Followup with Family Doctor if not improved in about 5 days.    Final Clinical Impressions(s) / UC Diagnoses   Final diagnoses:  Ingrown nail of great toe of left foot    New Prescriptions New Prescriptions   DOXYCYCLINE (VIBRAMYCIN) 100 MG CAPSULE    Take 1 capsule (100 mg total) by mouth 2 (two) times daily. Take with food.   HYDROCODONE-ACETAMINOPHEN (NORCO/VICODIN) 5-325 MG TABLET    Take  1 tablet by mouth every 6 (six) hours as needed for moderate pain.     Beese, StephLattie Hawen A, MD 04/14/17 [phone]

## 2017-04-17 ENCOUNTER — Telehealth: Payer: Self-pay

## 2017-04-17 LAB — WOUND CULTURE

## 2017-04-17 NOTE — Telephone Encounter (Signed)
Called patient and given lab results.  Instructed to take medication as prescribed and follow up as needed.

## 2017-04-22 ENCOUNTER — Other Ambulatory Visit: Payer: Self-pay | Admitting: Family Medicine

## 2017-05-11 ENCOUNTER — Encounter: Payer: Self-pay | Admitting: Sports Medicine

## 2017-05-11 ENCOUNTER — Ambulatory Visit (INDEPENDENT_AMBULATORY_CARE_PROVIDER_SITE_OTHER): Payer: BLUE CROSS/BLUE SHIELD | Admitting: Sports Medicine

## 2017-05-11 VITALS — BP 138/86 | HR 78 | Temp 97.9°F | Wt 293.0 lb

## 2017-05-11 DIAGNOSIS — M170 Bilateral primary osteoarthritis of knee: Secondary | ICD-10-CM

## 2017-05-11 DIAGNOSIS — Z23 Encounter for immunization: Secondary | ICD-10-CM

## 2017-05-11 DIAGNOSIS — E6609 Other obesity due to excess calories: Secondary | ICD-10-CM | POA: Diagnosis not present

## 2017-05-11 DIAGNOSIS — S81812D Laceration without foreign body, left lower leg, subsequent encounter: Secondary | ICD-10-CM

## 2017-05-11 DIAGNOSIS — S81812A Laceration without foreign body, left lower leg, initial encounter: Secondary | ICD-10-CM | POA: Insufficient documentation

## 2017-05-11 MED ORDER — DOXYCYCLINE HYCLATE 100 MG PO TABS: 100.0000 mg | ORAL_TABLET | Freq: Two times a day (BID) | ORAL | 0 refills | Status: AC

## 2017-05-11 MED ORDER — TRAMADOL HCL 50 MG PO TABS: 100.0000 mg | ORAL_TABLET | Freq: Two times a day (BID) | ORAL | 0 refills | Status: DC

## 2017-05-11 MED ORDER — INDOMETHACIN 50 MG PO CAPS: 50.0000 mg | ORAL_CAPSULE | Freq: Two times a day (BID) | ORAL | 0 refills | Status: DC

## 2017-05-11 NOTE — Progress Notes (Signed)
  Subjective:    CC: Follow-up  HPI: Obesity: Missed a month but still lost 10 pounds, needs a refill on phentermine.  Laceration: Lower leg, 11 days ago, closed in the emergency department but seems to have dehisced. Little bit of erythema, pain.  Knee osteoarthritis: Awaiting his wife's surgery before he can have his knee surgery, he has been doing well on about 200 mg of tramadol daily, needs a refill.  Past medical history:  Negative.  See flowsheet/record as well for more information.  Surgical history: Negative.  See flowsheet/record as well for more information.  Family history: Negative.  See flowsheet/record as well for more information.  Social history: Negative.  See flowsheet/record as well for more information.  Allergies, and medications have been entered into the medical record, reviewed, and no changes needed.   Review of Systems: No fevers, chills, night sweats, weight loss, chest pain, or shortness of breath.   Objective:    General: Well Developed, well nourished, and in no acute distress.  Neuro: Alert and oriented x3, extra-ocular muscles intact, sensation grossly intact.  HEENT: Normocephalic, atraumatic, pupils equal round reactive to light, neck supple, no masses, no lymphadenopathy, thyroid nonpalpable.  Skin: Warm and dry, no rashes. Cardiac: Regular rate and rhythm, no murmurs rubs or gallops, no lower extremity edema.  Respiratory: Clear to auscultation bilaterally. Not using accessory muscles, speaking in full sentences. Left leg: There is a dehisced laceration, with overlying eschar, multiple simple interrupted sutures were removed. Slight surrounding erythema and tenderness.  Impression and Recommendations:    Primary osteoarthritis of both knees Refilling tramadol, 200 mg extended release tramadol was not helpful, and not covered. He uses approximately 4 pills per day.  Obesity He did miss a month but 10 pound weight loss since phentermine was first  prescribed.  Refilling medication, return monthly for weight checks and refills. Entering the second month.  Laceration of leg, left Closure performed in the emergency department about 11 days ago has broken down, mild dehiscence with eschar. I removed the sutures, this will need to heal by secondary intention.  There is a touch of erythema, adding doxycycline.  He was given a tetanus shot in the emergency department.  I spent 25 minutes with this patient, greater than 50% was face-to-face time counseling regarding the above diagnoses

## 2017-05-11 NOTE — Assessment & Plan Note (Signed)
He did miss a month but 10 pound weight loss since phentermine was first prescribed.  Refilling medication, return monthly for weight checks and refills. Entering the second month.

## 2017-05-11 NOTE — Assessment & Plan Note (Signed)
Refilling tramadol, 200 mg extended release tramadol was not helpful, and not covered. He uses approximately 4 pills per day.

## 2017-05-11 NOTE — Assessment & Plan Note (Addendum)
Closure performed in the emergency department about 11 days ago has broken down, mild dehiscence with eschar. I removed the sutures, this will need to heal by secondary intention.  There is a touch of erythema, adding doxycycline.  He was given a tetanus shot in the emergency department.

## 2017-06-08 ENCOUNTER — Ambulatory Visit (INDEPENDENT_AMBULATORY_CARE_PROVIDER_SITE_OTHER): Payer: BLUE CROSS/BLUE SHIELD | Admitting: Sports Medicine

## 2017-06-08 ENCOUNTER — Encounter: Payer: Self-pay | Admitting: Sports Medicine

## 2017-06-08 DIAGNOSIS — M7551 Bursitis of right shoulder: Secondary | ICD-10-CM

## 2017-06-08 DIAGNOSIS — E6609 Other obesity due to excess calories: Secondary | ICD-10-CM | POA: Diagnosis not present

## 2017-06-08 DIAGNOSIS — S81812D Laceration without foreign body, left lower leg, subsequent encounter: Secondary | ICD-10-CM | POA: Diagnosis not present

## 2017-06-08 DIAGNOSIS — M75101 Unspecified rotator cuff tear or rupture of right shoulder, not specified as traumatic: Secondary | ICD-10-CM | POA: Insufficient documentation

## 2017-06-08 DIAGNOSIS — M12811 Other specific arthropathies, not elsewhere classified, right shoulder: Secondary | ICD-10-CM | POA: Insufficient documentation

## 2017-06-08 MED ORDER — PHENTERMINE HCL 37.5 MG PO TABS: ORAL_TABLET | ORAL | 0 refills | Status: DC

## 2017-06-08 NOTE — Assessment & Plan Note (Signed)
Additional 5 pound weight loss, entering the fourth month. 

## 2017-06-08 NOTE — Progress Notes (Signed)
  Subjective:    CC: follow-up  HPI: Obesity: 5 pound weight loss.  Right shoulder pain: Present for several days, severe, localized over the deltoid and worse with overhead activities, no trauma, no constitutional symptoms.  Left leg laceration: Was repaired sometime ago in the emergency department, when I saw him the sutures have been in for far too long and he developed a wound infection.   Past medical history:  Negative.  See flowsheet/record as well for more information.  Surgical history: Negative.  See flowsheet/record as well for more information.  Family history: Negative.  See flowsheet/record as well for more information.  Social history: Negative.  See flowsheet/record as well for more information.  Allergies, and medications have been entered into the medical record, reviewed, and no changes needed.   Review of Systems: No fevers, chills, night sweats, weight loss, chest pain, or shortness of breath.   Objective:    General: Well Developed, well nourished, and in no acute distress.  Neuro: Alert and oriented x3, extra-ocular muscles intact, sensation grossly intact.  HEENT: Normocephalic, atraumatic, pupils equal round reactive to light, neck supple, no masses, no lymphadenopathy, thyroid nonpalpable.  Skin: Warm and dry, no rashes. Cardiac: Regular rate and rhythm, no murmurs rubs or gallops, no lower extremity edema.  Respiratory: Clear to auscultation bilaterally. Not using accessory muscles, speaking in full sentences. Right Shoulder: Inspection reveals no abnormalities, atrophy or asymmetry. Palpation is normal with no tenderness over AC joint or bicipital groove. ROM is full in all planes. Rotator cuff strength normal throughout. positive Neer and Hawkin's tests, empty can. Speeds and Yergason's tests normal. No labral pathology noted with negative Obrien's, negative crank, negative clunk, and good stability. Normal scapular function observed. No painful arc  and no drop arm sign. No apprehension sign Left leg: This improved with doxycycline however the wound has broken open, there is an eschar over top, no erythema..  Procedure:  Injection of right subchondral bursa Consent obtained and verified. Time-out conducted. Noted no overlying erythema, induration, or other signs of local infection. Skin prepped in a sterile fashion. Topical analgesic spray: Ethyl chloride. Completed without difficulty. Meds: using a 25-gauge needle advanced from a posterior approach approximately 1 cm below the acromion, I angled needle upward towards the coracoid process and skimming under the acromion I injected 1 mL kenalog 40, 1 mL lidocaine, 1 mL bupivacaine. Pain immediately improved suggesting accurate placement of the medication. Advised to call if fevers/chills, erythema, induration, drainage, or persistent bleeding.  Impression and Recommendations:    Obesity Additional 5 pound weight loss, entering the fourth month.  Laceration of leg, left This is going to need to heal by secondary intention. Redressed, he will keep it dressed with antibiotic ointment.  Acute bursitis of right shoulder Subacromial injection, rehabilitation exercises, return in one month.  ___________________________________________ Ihor Austin. Benjamin Stain, M.D., ABFM., CAQSM. Primary Care and Sports Medicine [facility]  Adjunct Instructor of Family Medicine  [employer]

## 2017-06-08 NOTE — Assessment & Plan Note (Signed)
This is going to need to heal by secondary intention. Redressed, he will keep it dressed with antibiotic ointment.

## 2017-06-08 NOTE — Assessment & Plan Note (Signed)
Subacromial injection, rehabilitation exercises, return in one month.

## 2017-06-20 ENCOUNTER — Other Ambulatory Visit: Payer: Self-pay | Admitting: Sports Medicine

## 2017-06-20 DIAGNOSIS — M170 Bilateral primary osteoarthritis of knee: Secondary | ICD-10-CM

## 2017-06-21 ENCOUNTER — Telehealth: Payer: Self-pay | Admitting: *Deleted

## 2017-06-21 NOTE — Telephone Encounter (Signed)
[id number]

## 2017-06-26 NOTE — Telephone Encounter (Signed)
Phentermine  Has been approved. The member is  limited to 12 weeks of therapy per year. Pharmacy notified via vm

## 2017-07-06 ENCOUNTER — Ambulatory Visit: Payer: BLUE CROSS/BLUE SHIELD | Admitting: Sports Medicine

## 2017-07-06 DIAGNOSIS — Z0189 Encounter for other specified special examinations: Secondary | ICD-10-CM

## 2017-07-07 ENCOUNTER — Ambulatory Visit (INDEPENDENT_AMBULATORY_CARE_PROVIDER_SITE_OTHER): Payer: BLUE CROSS/BLUE SHIELD | Admitting: Sports Medicine

## 2017-07-07 ENCOUNTER — Encounter: Payer: Self-pay | Admitting: Sports Medicine

## 2017-07-07 DIAGNOSIS — I10 Essential (primary) hypertension: Secondary | ICD-10-CM

## 2017-07-07 DIAGNOSIS — E6609 Other obesity due to excess calories: Secondary | ICD-10-CM | POA: Diagnosis not present

## 2017-07-07 MED ORDER — INDOMETHACIN 50 MG PO CAPS: 50.0000 mg | ORAL_CAPSULE | Freq: Two times a day (BID) | ORAL | 0 refills | Status: DC

## 2017-07-07 MED ORDER — HYDROCHLOROTHIAZIDE 25 MG PO TABS: ORAL_TABLET | ORAL | 1 refills | Status: DC

## 2017-07-07 MED ORDER — PHENTERMINE HCL 37.5 MG PO TABS: ORAL_TABLET | ORAL | 0 refills | Status: DC

## 2017-07-07 MED ORDER — ALBUTEROL SULFATE 108 (90 BASE) MCG/ACT IN AEPB: 2.0000 | INHALATION_SPRAY | Freq: Four times a day (QID) | RESPIRATORY_TRACT | 11 refills | Status: DC | PRN

## 2017-07-07 NOTE — Assessment & Plan Note (Signed)
6 pound weight loss, entering the fifth month. I do suspect we will be adding Topamax after the 8280-month

## 2017-07-07 NOTE — Assessment & Plan Note (Signed)
Good control on recheck.

## 2017-07-07 NOTE — Progress Notes (Signed)
  Subjective:    CC: Weight check  HPI: After 5 months of phentermine Jonathan Villarreal has lost an extra 6 pounds.  Hypertension: Controlled on recheck.  Past medical history:  Negative.  See flowsheet/record as well for more information.  Surgical history: Negative.  See flowsheet/record as well for more information.  Family history: Negative.  See flowsheet/record as well for more information.  Social history: Negative.  See flowsheet/record as well for more information.  Allergies, and medications have been entered into the medical record, reviewed, and no changes needed.   Review of Systems: No fevers, chills, night sweats, weight loss, chest pain, or shortness of breath.   Objective:    General: Well Developed, well nourished, and in no acute distress.  Neuro: Alert and oriented x3, extra-ocular muscles intact, sensation grossly intact.  HEENT: Normocephalic, atraumatic, pupils equal round reactive to light, neck supple, no masses, no lymphadenopathy, thyroid nonpalpable.  Skin: Warm and dry, no rashes. Cardiac: Regular rate and rhythm, no murmurs rubs or gallops, no lower extremity edema.  Respiratory: Clear to auscultation bilaterally. Not using accessory muscles, speaking in full sentences.  Impression and Recommendations:    Obesity 6 pound weight loss, entering the fifth month. I do suspect we will be adding Topamax after the 2935-month  Essential hypertension, benign Good control on recheck.  I spent 25 minutes with this patient, greater than 50% was face-to-face time counseling regarding the above diagnoses, I did spend extra time filling out FMLA forms. ___________________________________________ Ihor Austinhomas J. Benjamin Stainhekkekandam, M.D., ABFM., CAQSM. Primary Care and Sports Medicine [facility]  Adjunct Instructor of Family Medicine  [employer]

## 2017-07-16 ENCOUNTER — Other Ambulatory Visit: Payer: Self-pay | Admitting: Sports Medicine

## 2017-07-16 DIAGNOSIS — M170 Bilateral primary osteoarthritis of knee: Secondary | ICD-10-CM

## 2017-08-07 ENCOUNTER — Ambulatory Visit: Payer: BLUE CROSS/BLUE SHIELD

## 2017-08-08 ENCOUNTER — Ambulatory Visit (INDEPENDENT_AMBULATORY_CARE_PROVIDER_SITE_OTHER): Payer: BLUE CROSS/BLUE SHIELD | Admitting: Sports Medicine

## 2017-08-08 DIAGNOSIS — E6609 Other obesity due to excess calories: Secondary | ICD-10-CM

## 2017-08-08 DIAGNOSIS — M5416 Radiculopathy, lumbar region: Secondary | ICD-10-CM

## 2017-08-08 DIAGNOSIS — M25511 Pain in right shoulder: Secondary | ICD-10-CM

## 2017-08-08 DIAGNOSIS — M7551 Bursitis of right shoulder: Secondary | ICD-10-CM

## 2017-08-08 DIAGNOSIS — G8929 Other chronic pain: Secondary | ICD-10-CM

## 2017-08-08 NOTE — Assessment & Plan Note (Signed)
Subacromial injection provided several months of relief but now having recurrence of pain, proceeding with MRI.

## 2017-08-08 NOTE — Assessment & Plan Note (Signed)
No weight loss but did not take his phentermine this month, still has the prescription at the pharmacy. So essentially we will continue this entering the fifth month again. Again I do suspect that we will be adding Topamax.

## 2017-08-08 NOTE — Assessment & Plan Note (Signed)
Recurrence of radicular symptoms, this time in a left L5 distribution. He recently went to the emergency department and got muscle relaxers, prednisone and oxycodone. Proceeding with aggressive physical therapy, return in 4 weeks, MRI for interventional planning if no better.

## 2017-08-08 NOTE — Progress Notes (Signed)
  Subjective:    CC: Weight check, shoulder pain, leg pain  HPI: Obesity: Gained weight, did not fill his phentermine prescription this month.  Shoulder pain: We did a subacromial injection couple of months ago, now having a recurrence of pain.  Moderate, persistent, localized over the deltoid, worse with abduction and overhead activities.  Left leg pain:  Going from the back all the way down the leg to the middle 3 toes, known history of lumbar radiculitis, was given oxycodone and prednisone in his recent emergency department visit.  Has not done any physical therapy yet.  No bowel or bladder dysfunction, saddle numbness, no constitutional symptoms.  Past medical history:  Negative.  See flowsheet/record as well for more information.  Surgical history: Negative.  See flowsheet/record as well for more information.  Family history: Negative.  See flowsheet/record as well for more information.  Social history: Negative.  See flowsheet/record as well for more information.  Allergies, and medications have been entered into the medical record, reviewed, and no changes needed.   Review of Systems: No fevers, chills, night sweats, weight loss, chest pain, or shortness of breath.   Objective:    General: Well Developed, well nourished, and in no acute distress.  Neuro: Alert and oriented x3, extra-ocular muscles intact, sensation grossly intact.  HEENT: Normocephalic, atraumatic, pupils equal round reactive to light, neck supple, no masses, no lymphadenopathy, thyroid nonpalpable.  Skin: Warm and dry, no rashes. Cardiac: Regular rate and rhythm, no murmurs rubs or gallops, no lower extremity edema.  Respiratory: Clear to auscultation bilaterally. Not using accessory muscles, speaking in full sentences.   Impression and Recommendations:    Obesity No weight loss but did not take his phentermine this month, still has the prescription at the pharmacy. So essentially we will continue this  entering the fifth month again. Again I do suspect that we will be adding Topamax.  Bilateral lumbar radiculopathy Recurrence of radicular symptoms, this time in a left L5 distribution. He recently went to the emergency department and got muscle relaxers, prednisone and oxycodone. Proceeding with aggressive physical therapy, return in 4 weeks, MRI for interventional planning if no better.  Acute bursitis of right shoulder Subacromial injection provided several months of relief but now having recurrence of pain, proceeding with MRI.  I spent 25 minutes with this patient, greater than 50% was face-to-face time in counseling regarding the above multiple diagnoses. ___________________________________________ Ihor Austinhomas J. Benjamin Stainhekkekandam, M.D., ABFM., CAQSM. Primary Care and Sports Medicine [facility]  Adjunct Instructor of Family Medicine  [employer]

## 2017-08-10 ENCOUNTER — Other Ambulatory Visit: Payer: Self-pay | Admitting: Sports Medicine

## 2017-08-15 ENCOUNTER — Encounter: Payer: Self-pay | Admitting: Sports Medicine

## 2017-08-15 ENCOUNTER — Ambulatory Visit: Payer: BLUE CROSS/BLUE SHIELD | Admitting: Sports Medicine

## 2017-08-15 DIAGNOSIS — L0291 Cutaneous abscess, unspecified: Secondary | ICD-10-CM | POA: Insufficient documentation

## 2017-08-15 DIAGNOSIS — E6609 Other obesity due to excess calories: Secondary | ICD-10-CM

## 2017-08-15 DIAGNOSIS — M7551 Bursitis of right shoulder: Secondary | ICD-10-CM | POA: Diagnosis not present

## 2017-08-15 DIAGNOSIS — L0211 Cutaneous abscess of neck: Secondary | ICD-10-CM | POA: Diagnosis not present

## 2017-08-15 MED ORDER — OXYCODONE-ACETAMINOPHEN 5-325 MG PO TABS: 1.0000 | ORAL_TABLET | Freq: Three times a day (TID) | ORAL | 0 refills | Status: DC | PRN

## 2017-08-15 MED ORDER — DOXYCYCLINE HYCLATE 100 MG PO TABS: 100.0000 mg | ORAL_TABLET | Freq: Two times a day (BID) | ORAL | 0 refills | Status: AC

## 2017-08-15 NOTE — Assessment & Plan Note (Signed)
Awaiting MRI, repeat subacromial injection today, this will be his last injection.

## 2017-08-15 NOTE — Assessment & Plan Note (Signed)
I sent in his phentermine but he never picked it up. We are essentially entering the fifth month. If insufficient weight loss in 1 month we will add Topamax.

## 2017-08-15 NOTE — Progress Notes (Signed)
  Subjective:    CC: Abscess  HPI: This is a pleasant 46 year old male, he went to the emergency department 2 days ago with an abscess on the back of his head, this was drained, packed and he was placed on Keflex, packing has fallen out, still has significant pain.  Right shoulder pain: Subacromial impingement type symptoms, MRIs coming up, desires injection today, pain is localized, moderate, persistent.  Obesity: Has not yet started phentermine  Past medical history:  Negative.  See flowsheet/record as well for more information.  Surgical history: Negative.  See flowsheet/record as well for more information.  Family history: Negative.  See flowsheet/record as well for more information.  Social history: Negative.  See flowsheet/record as well for more information.  Allergies, and medications have been entered into the medical record, reviewed, and no changes needed.   Review of Systems: No fevers, chills, night sweats, weight loss, chest pain, or shortness of breath.   Objective:    General: Well Developed, well nourished, and in no acute distress.  Neuro: Alert and oriented x3, extra-ocular muscles intact, sensation grossly intact.  HEENT: Normocephalic, atraumatic, pupils equal round reactive to light, neck supple, no masses, no lymphadenopathy, thyroid nonpalpable.  Skin: Warm and dry, no rashes. Cardiac: Regular rate and rhythm, no murmurs rubs or gallops, no lower extremity edema.  Respiratory: Clear to auscultation bilaterally. Not using accessory muscles, speaking in full sentences.  Procedure: Real-time Ultrasound Guided Injection of right subacromial bursa Device: GE Logiq E  Verbal informed consent obtained.  Time-out conducted.  Noted no overlying erythema, induration, or other signs of local infection.  Skin prepped in a sterile fashion.  Local anesthesia: Topical Ethyl chloride.  With sterile technique and under real time ultrasound guidance: Using a 25-gauge needle I  injected 1 cc kenalog 40, 1 cc lidocaine, 1 cc bupivacaine. Completed without difficulty  Pain immediately resolved suggesting accurate placement of the medication.  Advised to call if fevers/chills, erythema, induration, drainage, or persistent bleeding.  Images permanently stored and available for review in the ultrasound unit.  Impression: Technically successful ultrasound guided injection.  Impression and Recommendations:    Acute bursitis of right shoulder Awaiting MRI, repeat subacromial injection today, this will be his last injection.   Obesity I sent in his phentermine but he never picked it up. We are essentially entering the fifth month. If insufficient weight loss in 1 month we will add Topamax.  Skin abscess Incision and drainage performed by nurse practitioner in the emergency department 2 days ago. Packing has fallen out. He was placed on Keflex, switching to doxycycline, continue aggressive warm compresses. Oxycodone for pain. Return to see me in 1 week for this.  ___________________________________________ Jonathan Villarreal, M.D., ABFM., CAQSM. Primary Care and Sports Medicine [facility]  Adjunct Instructor of Family Medicine  [employer]

## 2017-08-15 NOTE — Assessment & Plan Note (Signed)
Incision and drainage performed by nurse practitioner in the emergency department 2 days ago. Packing has fallen out. He was placed on Keflex, switching to doxycycline, continue aggressive warm compresses. Oxycodone for pain. Return to see me in 1 week for this.

## 2017-08-22 ENCOUNTER — Telehealth: Payer: Self-pay | Admitting: Sports Medicine

## 2017-08-22 DIAGNOSIS — M170 Bilateral primary osteoarthritis of knee: Secondary | ICD-10-CM

## 2017-08-22 MED ORDER — TRAMADOL HCL 50 MG PO TABS: 100.0000 mg | ORAL_TABLET | Freq: Two times a day (BID) | ORAL | 0 refills | Status: DC

## 2017-08-22 NOTE — Telephone Encounter (Signed)
Pt called. He's still waiting for refill on Tramadol and new med he's supposed to taking with his diet pill.

## 2017-08-22 NOTE — Telephone Encounter (Signed)
Refilled

## 2017-08-24 ENCOUNTER — Ambulatory Visit: Payer: BLUE CROSS/BLUE SHIELD | Admitting: Sports Medicine

## 2017-09-04 ENCOUNTER — Ambulatory Visit: Payer: BLUE CROSS/BLUE SHIELD

## 2017-09-06 ENCOUNTER — Telehealth: Payer: Self-pay | Admitting: Sports Medicine

## 2017-09-06 DIAGNOSIS — F419 Anxiety disorder, unspecified: Secondary | ICD-10-CM

## 2017-09-06 DIAGNOSIS — F411 Generalized anxiety disorder: Secondary | ICD-10-CM

## 2017-09-06 MED ORDER — DIAZEPAM 5 MG PO TABS: ORAL_TABLET | ORAL | 0 refills | Status: DC

## 2017-09-06 NOTE — Telephone Encounter (Signed)
Attempted to contact Pt, no answer and VM is full.   Will have to re-auth MRI tomorrow when current approval has expired.

## 2017-09-06 NOTE — Telephone Encounter (Signed)
Prescription sent to [facility] pharmacy for Valium 1 tab 1 hour prior to MRI Do not combine with oxycodone or other sedating medications Patient will need a ride to and from his MRI

## 2017-09-06 NOTE — Telephone Encounter (Signed)
Pt attempted to have MRI completed Monday, it was determined he is very claustrophobic. Requested pre-meds. Will route.

## 2017-09-07 NOTE — Telephone Encounter (Signed)
Pt advised of Rx.  

## 2017-09-11 ENCOUNTER — Other Ambulatory Visit: Payer: BLUE CROSS/BLUE SHIELD

## 2017-09-11 ENCOUNTER — Ambulatory Visit (INDEPENDENT_AMBULATORY_CARE_PROVIDER_SITE_OTHER): Payer: BLUE CROSS/BLUE SHIELD

## 2017-09-11 DIAGNOSIS — M7551 Bursitis of right shoulder: Secondary | ICD-10-CM | POA: Diagnosis not present

## 2017-09-15 ENCOUNTER — Other Ambulatory Visit: Payer: Self-pay | Admitting: Sports Medicine

## 2017-09-15 DIAGNOSIS — M170 Bilateral primary osteoarthritis of knee: Secondary | ICD-10-CM

## 2017-09-18 ENCOUNTER — Ambulatory Visit (INDEPENDENT_AMBULATORY_CARE_PROVIDER_SITE_OTHER): Payer: BLUE CROSS/BLUE SHIELD | Admitting: Sports Medicine

## 2017-09-18 ENCOUNTER — Ambulatory Visit: Payer: BLUE CROSS/BLUE SHIELD | Admitting: Sports Medicine

## 2017-09-18 ENCOUNTER — Encounter: Payer: Self-pay | Admitting: Sports Medicine

## 2017-09-18 DIAGNOSIS — M75121 Complete rotator cuff tear or rupture of right shoulder, not specified as traumatic: Secondary | ICD-10-CM

## 2017-09-18 DIAGNOSIS — L723 Sebaceous cyst: Secondary | ICD-10-CM | POA: Diagnosis not present

## 2017-09-18 DIAGNOSIS — L0211 Cutaneous abscess of neck: Secondary | ICD-10-CM | POA: Diagnosis not present

## 2017-09-18 DIAGNOSIS — E6609 Other obesity due to excess calories: Secondary | ICD-10-CM

## 2017-09-18 MED ORDER — PHENTERMINE HCL 37.5 MG PO TABS: ORAL_TABLET | ORAL | 0 refills | Status: DC

## 2017-09-18 NOTE — Assessment & Plan Note (Signed)
Full-thickness retracted tear. He did have a good response after his subacromial injection however tends to get recurrence of pain with activity at home. I do think we need to consider rotator cuff repair, referral to Dr. Ave Filterhandler.

## 2017-09-18 NOTE — Assessment & Plan Note (Signed)
Resolved

## 2017-09-18 NOTE — Assessment & Plan Note (Signed)
Return for excision at his leisure.

## 2017-09-18 NOTE — Progress Notes (Signed)
  Subjective:    CC: Weight check  HPI: Obesity: Good weight loss after the first month.  Neck abscess: Resolved with antibiotics and I&D.  Scalp mass: Present for years, well-defined, movable, nontender.  Right shoulder pain: Did well after a subacromial injection but still had some discomfort with lifting, MRI ultimately showed a complete retracted rotator cuff tear.  I reviewed the past medical history, family history, social history, surgical history, and allergies today and no changes were needed.  Please see the problem list section below in epic for further details.  Past Medical History: Past Medical History:  Diagnosis Date  . Asthma    Past Surgical History: Past Surgical History:  Procedure Laterality Date  . APPENDECTOMY     Social History: Social History   Socioeconomic History  . Marital status: Married    Spouse name: None  . Number of children: None  . Years of education: None  . Highest education level: None  Social Needs  . Financial resource strain: None  . Food insecurity - worry: None  . Food insecurity - inability: None  . Transportation needs - medical: None  . Transportation needs - non-medical: None  Occupational History  . None  Tobacco Use  . Smoking status: Former Smoker    Packs/day: 0.50    Types: Cigarettes  . Smokeless tobacco: Never Used  Substance and Sexual Activity  . Alcohol use: Yes    Alcohol/week: 0.0 oz  . Drug use: No  . Sexual activity: None  Other Topics Concern  . None  Social History Narrative  . None   Family History: Family History  Problem Relation Age of Onset  . Diabetes Mother   . Hypertension Father    Allergies: Allergies  Allergen Reactions  . Bee Venom Anaphylaxis   Medications: See med rec.  Review of Systems: No fevers, chills, night sweats, weight loss, chest pain, or shortness of breath.   Objective:    General: Well Developed, well nourished, and in no acute distress.  Neuro: Alert  and oriented x3, extra-ocular muscles intact, sensation grossly intact.  HEENT: Normocephalic, atraumatic, pupils equal round reactive to light, neck supple, no masses, no lymphadenopathy, thyroid nonpalpable.  Skin: Warm and dry, no rashes.  There is a 2-3 cm sebaceous cyst subcutaneous on his scalp posterior. Cardiac: Regular rate and rhythm, no murmurs rubs or gallops, no lower extremity edema.  Respiratory: Clear to auscultation bilaterally. Not using accessory muscles, speaking in full sentences.  Impression and Recommendations:    Rotator cuff tear, right Full-thickness retracted tear. He did have a good response after his subacromial injection however tends to get recurrence of pain with activity at home. I do think we need to consider rotator cuff repair, referral to Dr. Ave Filterhandler.  Obesity 1 pound weight loss, entering the second month, refilling. We still have Topamax if needed.  Skin abscess Resolved  Sebaceous cyst Return for excision at his leisure. ___________________________________________ Ihor Austinhomas J. Benjamin Stainhekkekandam, M.D., ABFM., CAQSM. Primary Care and Sports Medicine [facility]  Adjunct Instructor of Family Medicine  [employer]

## 2017-09-18 NOTE — Assessment & Plan Note (Signed)
1 pound weight loss, entering the second month, refilling. We still have Topamax if needed.

## 2017-09-21 ENCOUNTER — Other Ambulatory Visit: Payer: Self-pay | Admitting: Sports Medicine

## 2017-09-21 DIAGNOSIS — M170 Bilateral primary osteoarthritis of knee: Secondary | ICD-10-CM

## 2017-10-16 ENCOUNTER — Encounter: Payer: Self-pay | Admitting: Sports Medicine

## 2017-10-16 ENCOUNTER — Ambulatory Visit (INDEPENDENT_AMBULATORY_CARE_PROVIDER_SITE_OTHER): Payer: BLUE CROSS/BLUE SHIELD | Admitting: Sports Medicine

## 2017-10-16 DIAGNOSIS — E6609 Other obesity due to excess calories: Secondary | ICD-10-CM | POA: Diagnosis not present

## 2017-10-16 MED ORDER — PHENTERMINE HCL 37.5 MG PO TABS: ORAL_TABLET | ORAL | 0 refills | Status: DC

## 2017-10-16 MED ORDER — TOPIRAMATE 50 MG PO TABS: ORAL_TABLET | ORAL | 0 refills | Status: DC

## 2017-10-16 NOTE — Assessment & Plan Note (Signed)
2 pound weight loss after the first month, entering the second month, adding Topamax as well. Return in 1 month.

## 2017-10-16 NOTE — Progress Notes (Signed)
  Subjective:    CC: Weight check  HPI: Reginia Fortsablo returns, he did well after the first month on phentermine, he is only lost 2 pounds after the second month.  No adverse effects.  I reviewed the past medical history, family history, social history, surgical history, and allergies today and no changes were needed.  Please see the problem list section below in epic for further details.  Past Medical History: Past Medical History:  Diagnosis Date  . Asthma    Past Surgical History: Past Surgical History:  Procedure Laterality Date  . APPENDECTOMY     Social History: Social History   Socioeconomic History  . Marital status: Married    Spouse name: None  . Number of children: None  . Years of education: None  . Highest education level: None  Social Needs  . Financial resource strain: None  . Food insecurity - worry: None  . Food insecurity - inability: None  . Transportation needs - medical: None  . Transportation needs - non-medical: None  Occupational History  . None  Tobacco Use  . Smoking status: Former Smoker    Packs/day: 0.50    Types: Cigarettes  . Smokeless tobacco: Never Used  Substance and Sexual Activity  . Alcohol use: Yes    Alcohol/week: 0.0 oz  . Drug use: No  . Sexual activity: None  Other Topics Concern  . None  Social History Narrative  . None   Family History: Family History  Problem Relation Age of Onset  . Diabetes Mother   . Hypertension Father    Allergies: Allergies  Allergen Reactions  . Bee Venom Anaphylaxis   Medications: See med rec.  Review of Systems: No fevers, chills, night sweats, weight loss, chest pain, or shortness of breath.   Objective:    General: Well Developed, well nourished, and in no acute distress.  Neuro: Alert and oriented x3, extra-ocular muscles intact, sensation grossly intact.  HEENT: Normocephalic, atraumatic, pupils equal round reactive to light, neck supple, no masses, no lymphadenopathy, thyroid  nonpalpable.  Skin: Warm and dry, no rashes. Cardiac: Regular rate and rhythm, no murmurs rubs or gallops, no lower extremity edema.  Respiratory: Clear to auscultation bilaterally. Not using accessory muscles, speaking in full sentences.  Impression and Recommendations:    Obesity 2 pound weight loss after the first month, entering the second month, adding Topamax as well. Return in 1 month. ___________________________________________ Ihor Austinhomas J. Benjamin Stainhekkekandam, M.D., ABFM., CAQSM. Primary Care and Sports Medicine [facility]  Adjunct Instructor of Family Medicine  [employer]

## 2017-10-17 ENCOUNTER — Other Ambulatory Visit: Payer: Self-pay | Admitting: Sports Medicine

## 2017-10-17 ENCOUNTER — Encounter: Payer: Self-pay | Admitting: Sports Medicine

## 2017-10-17 DIAGNOSIS — M170 Bilateral primary osteoarthritis of knee: Secondary | ICD-10-CM

## 2017-11-12 ENCOUNTER — Other Ambulatory Visit: Payer: Self-pay | Admitting: Sports Medicine

## 2017-11-13 ENCOUNTER — Encounter: Payer: Self-pay | Admitting: Sports Medicine

## 2017-11-13 ENCOUNTER — Ambulatory Visit (INDEPENDENT_AMBULATORY_CARE_PROVIDER_SITE_OTHER): Payer: BLUE CROSS/BLUE SHIELD | Admitting: Sports Medicine

## 2017-11-13 ENCOUNTER — Other Ambulatory Visit: Payer: Self-pay | Admitting: Sports Medicine

## 2017-11-13 DIAGNOSIS — E6609 Other obesity due to excess calories: Secondary | ICD-10-CM | POA: Diagnosis not present

## 2017-11-13 DIAGNOSIS — M75121 Complete rotator cuff tear or rupture of right shoulder, not specified as traumatic: Secondary | ICD-10-CM

## 2017-11-13 DIAGNOSIS — M170 Bilateral primary osteoarthritis of knee: Secondary | ICD-10-CM | POA: Diagnosis not present

## 2017-11-13 MED ORDER — TRAMADOL HCL 50 MG PO TABS: 100.0000 mg | ORAL_TABLET | Freq: Two times a day (BID) | ORAL | 0 refills | Status: DC

## 2017-11-13 MED ORDER — PHENTERMINE HCL 37.5 MG PO TABS: ORAL_TABLET | ORAL | 0 refills | Status: DC

## 2017-11-13 MED ORDER — ACARBOSE 50 MG PO TABS: 50.0000 mg | ORAL_TABLET | Freq: Three times a day (TID) | ORAL | 3 refills | Status: DC

## 2017-11-13 MED ORDER — TOPIRAMATE 50 MG PO TABS: 50.0000 mg | ORAL_TABLET | Freq: Two times a day (BID) | ORAL | 0 refills | Status: DC

## 2017-11-13 NOTE — Assessment & Plan Note (Signed)
Repeat injection. I did tell him he needs to get surgical consultation from Dr. Ave Filterhandler.

## 2017-11-13 NOTE — Assessment & Plan Note (Signed)
Entering the third month, he gained 2 pounds, doubling Topamax, refilling phentermine, adding acarbose. Return in 1 month for a weight check.

## 2017-11-13 NOTE — Progress Notes (Signed)
Subjective:    CC: Follow-up  HPI: Obesity: Has gained 2 pounds.  Right shoulder pain: Known rotator cuff dysfunction, has not yet seen Dr. Ave Filter, previous injection was several months ago, pain is moderate, persistent, localized over the deltoid and worse with abduction.  Needs a refill on tramadol for this.  I reviewed the past medical history, family history, social history, surgical history, and allergies today and no changes were needed.  Please see the problem list section below in epic for further details.  Past Medical History: Past Medical History:  Diagnosis Date  . Asthma    Past Surgical History: Past Surgical History:  Procedure Laterality Date  . APPENDECTOMY     Social History: Social History   Socioeconomic History  . Marital status: Married    Spouse name: None  . Number of children: None  . Years of education: None  . Highest education level: None  Social Needs  . Financial resource strain: None  . Food insecurity - worry: None  . Food insecurity - inability: None  . Transportation needs - medical: None  . Transportation needs - non-medical: None  Occupational History  . None  Tobacco Use  . Smoking status: Former Smoker    Packs/day: 0.50    Types: Cigarettes  . Smokeless tobacco: Never Used  Substance and Sexual Activity  . Alcohol use: Yes    Alcohol/week: 0.0 oz  . Drug use: No  . Sexual activity: None  Other Topics Concern  . None  Social History Narrative  . None   Family History: Family History  Problem Relation Age of Onset  . Diabetes Mother   . Hypertension Father    Allergies: Allergies  Allergen Reactions  . Bee Venom Anaphylaxis   Medications: See med rec.  Review of Systems: No fevers, chills, night sweats, weight loss, chest pain, or shortness of breath.   Objective:    General: Well Developed, well nourished, and in no acute distress.  Neuro: Alert and oriented x3, extra-ocular muscles intact, sensation  grossly intact.  HEENT: Normocephalic, atraumatic, pupils equal round reactive to light, neck supple, no masses, no lymphadenopathy, thyroid nonpalpable.  Skin: Warm and dry, no rashes. Cardiac: Regular rate and rhythm, no murmurs rubs or gallops, no lower extremity edema.  Respiratory: Clear to auscultation bilaterally. Not using accessory muscles, speaking in full sentences. Right shoulder: Inspection reveals no abnormalities, atrophy or asymmetry. Palpation is normal with no tenderness over AC joint or bicipital groove. ROM is full in all planes. Rotator cuff strength normal throughout. Positive Neer and Hawkin's tests, empty can. Speeds and Yergason's tests normal. No labral pathology noted with negative Obrien's, negative crank, negative clunk, and good stability. Normal scapular function observed. No painful arc and no drop arm sign. No apprehension sign  Procedure: Real-time Ultrasound Guided Injection of right subacromial bursa Device: GE Logiq E  Verbal informed consent obtained.  Time-out conducted.  Noted no overlying erythema, induration, or other signs of local infection.  Skin prepped in a sterile fashion.  Local anesthesia: Topical Ethyl chloride.  With sterile technique and under real time ultrasound guidance: Noted distended subacromial bursa, 25-gauge needle advanced into the bursa, I then injected 1 cc kenalog 40, 2 1 cc lidocaine, 1 cc bupivacaine. Completed without difficulty  Pain immediately resolved suggesting accurate placement of the medication.  Advised to call if fevers/chills, erythema, induration, drainage, or persistent bleeding.  Images permanently stored and available for review in the ultrasound unit.  Impression: Technically successful ultrasound  guided injection.  Impression and Recommendations:    Rotator cuff tear, right Repeat injection. I did tell him he needs to get surgical consultation from Dr. Ave Filterhandler.  Obesity Entering the third  month, he gained 2 pounds, doubling Topamax, refilling phentermine, adding acarbose. Return in 1 month for a weight check.  ___________________________________________ Jonathan Villarreal, M.D., ABFM., CAQSM. Primary Care and Sports Medicine [facility]  Adjunct Instructor of Family Medicine  [employer]

## 2017-12-08 ENCOUNTER — Other Ambulatory Visit: Payer: Self-pay | Admitting: Sports Medicine

## 2017-12-08 DIAGNOSIS — E6609 Other obesity due to excess calories: Secondary | ICD-10-CM

## 2017-12-11 ENCOUNTER — Ambulatory Visit: Payer: BLUE CROSS/BLUE SHIELD | Admitting: Sports Medicine

## 2017-12-19 ENCOUNTER — Ambulatory Visit (INDEPENDENT_AMBULATORY_CARE_PROVIDER_SITE_OTHER): Payer: BLUE CROSS/BLUE SHIELD | Admitting: Sports Medicine

## 2017-12-19 ENCOUNTER — Encounter: Payer: Self-pay | Admitting: Sports Medicine

## 2017-12-19 DIAGNOSIS — E6609 Other obesity due to excess calories: Secondary | ICD-10-CM | POA: Diagnosis not present

## 2017-12-19 MED ORDER — PHENTERMINE HCL 37.5 MG PO TABS: ORAL_TABLET | ORAL | 0 refills | Status: DC

## 2017-12-19 MED ORDER — ACARBOSE 100 MG PO TABS: 100.0000 mg | ORAL_TABLET | Freq: Three times a day (TID) | ORAL | 1 refills | Status: DC

## 2017-12-19 MED ORDER — TOPIRAMATE 50 MG PO TABS: 50.0000 mg | ORAL_TABLET | Freq: Two times a day (BID) | ORAL | 0 refills | Status: DC

## 2017-12-19 NOTE — Progress Notes (Signed)
Subjective:    CC: f/u wt. check  HPI: Jonathan Villarreal is a 47yo male that presents today as a weight check in follow up.  Pt reports that he has been taking all of his medication as prescribed. Pt denies any intolerable side effects from his current medication regimen and has no concerns.   I reviewed the past medical history, family history, social history, surgical history, and allergies today and no changes were needed.  Please see the problem list section below in epic for further details.  Past Medical History: Past Medical History:  Diagnosis Date  . Asthma    Past Surgical History: Past Surgical History:  Procedure Laterality Date  . APPENDECTOMY     Social History: Social History   Socioeconomic History  . Marital status: Married    Spouse name: Not on file  . Number of children: Not on file  . Years of education: Not on file  . Highest education level: Not on file  Occupational History  . Not on file  Social Needs  . Financial resource strain: Not on file  . Food insecurity:    Worry: Not on file    Inability: Not on file  . Transportation needs:    Medical: Not on file    Non-medical: Not on file  Tobacco Use  . Smoking status: Former Smoker    Packs/day: 0.50    Types: Cigarettes  . Smokeless tobacco: Never Used  Substance and Sexual Activity  . Alcohol use: Yes    Alcohol/week: 0.0 oz  . Drug use: No  . Sexual activity: Not on file  Lifestyle  . Physical activity:    Days per week: Not on file    Minutes per session: Not on file  . Stress: Not on file  Relationships  . Social connections:    Talks on phone: Not on file    Gets together: Not on file    Attends religious service: Not on file    Active member of club or organization: Not on file    Attends meetings of clubs or organizations: Not on file    Relationship status: Not on file  Other Topics Concern  . Not on file  Social History Narrative  . Not on file   Family History: Family  History  Problem Relation Age of Onset  . Diabetes Mother   . Hypertension Father    Allergies: Allergies  Allergen Reactions  . Bee Venom Anaphylaxis   Medications: See med rec.  Review of Systems: No fevers, chills, night sweats, weight loss, chest pain, or shortness of breath.   Objective:    Vitals: Pt BMI is currently 45.8, down from 46.9 at the last visit   General: Well Developed, well nourished, and in no acute distress.  Neuro: Alert and oriented x3, extra-ocular muscles intact, sensation grossly intact.  HEENT: Normocephalic, atraumatic, pupils equal round reactive to light, neck supple, no masses, no lymphadenopathy, thyroid nonpalpable.  Skin: Warm and dry, no rashes. Cardiac: Regular rate and rhythm, no murmurs rubs or gallops, no lower extremity edema.  Respiratory: Clear to auscultation bilaterally. Not using accessory muscles, speaking in full sentences.   Impression and Recommendations:    Pt received motivational counseling to develop a SMART plan related to diet and exercise.  Pt was able to convey understanding of the positive effect weight loss will  have on health.   Pt endorses remaining physically active at work most days, all day, as well as at home  when his knees are not hurting him. Pt reports to drinking 64-80oz of water a day.  Pt reports that he his highly motivated (10/10) to continue to make progress in his weight loss.    Plan is to continue current regimen of weight loss medication and follow up in 1 month ___________________________________________ Jonathan Villarreal, M.D., ABFM., CAQSM. Primary Care and Sports Medicine [facility]  Adjunct Instructor of Family Medicine  [employer]

## 2017-12-19 NOTE — Assessment & Plan Note (Signed)
Weight loss after the third month, entering the fourth month. At the last visit we doubled Topamax, refilled phentermine and added acarbose. Increasing acarbose to 100 mg 3 times daily with meals, continue double dose Topamax and refilling phentermine. Return in 1 month for weight check and refills.

## 2017-12-20 ENCOUNTER — Telehealth: Payer: Self-pay | Admitting: Sports Medicine

## 2017-12-20 ENCOUNTER — Encounter: Payer: Self-pay | Admitting: Sports Medicine

## 2017-12-20 NOTE — Telephone Encounter (Signed)
Received fax from [employer] that Phentermine requires a PA. Information has been sent to the insurance company. Awaiting determination.   

## 2017-12-21 ENCOUNTER — Other Ambulatory Visit: Payer: Self-pay | Admitting: Sports Medicine

## 2017-12-21 DIAGNOSIS — M170 Bilateral primary osteoarthritis of knee: Secondary | ICD-10-CM

## 2017-12-22 MED ORDER — TRAMADOL HCL 50 MG PO TABS: 100.0000 mg | ORAL_TABLET | Freq: Two times a day (BID) | ORAL | 0 refills | Status: DC

## 2017-12-22 NOTE — Telephone Encounter (Signed)
[facility] requesting RF on Tramadol. Last refilled 11-13-17.  Please review and send if appropriate.   Thanks!

## 2018-01-02 NOTE — Telephone Encounter (Signed)
Phentermine has been approved and pharmacy is aware.

## 2018-01-17 ENCOUNTER — Other Ambulatory Visit: Payer: Self-pay | Admitting: Sports Medicine

## 2018-01-18 ENCOUNTER — Ambulatory Visit (INDEPENDENT_AMBULATORY_CARE_PROVIDER_SITE_OTHER): Payer: BLUE CROSS/BLUE SHIELD | Admitting: Sports Medicine

## 2018-01-18 DIAGNOSIS — M170 Bilateral primary osteoarthritis of knee: Secondary | ICD-10-CM | POA: Diagnosis not present

## 2018-01-18 DIAGNOSIS — E6609 Other obesity due to excess calories: Secondary | ICD-10-CM

## 2018-01-18 MED ORDER — ACARBOSE 100 MG PO TABS: 100.0000 mg | ORAL_TABLET | Freq: Three times a day (TID) | ORAL | 1 refills | Status: DC

## 2018-01-18 MED ORDER — TOPIRAMATE 50 MG PO TABS: 50.0000 mg | ORAL_TABLET | Freq: Two times a day (BID) | ORAL | 0 refills | Status: DC

## 2018-01-18 MED ORDER — PHENTERMINE HCL 37.5 MG PO TABS: ORAL_TABLET | ORAL | 0 refills | Status: DC

## 2018-01-18 MED ORDER — TRAMADOL HCL 50 MG PO TABS
100.0000 mg | ORAL_TABLET | Freq: Two times a day (BID) | ORAL | 0 refills | Status: DC
Start: 2018-01-18 — End: 2018-02-15

## 2018-01-18 NOTE — Assessment & Plan Note (Signed)
Two-pound weight loss, continue Topamax, phentermine, acarbose. Entering the fourth month.

## 2018-01-18 NOTE — Progress Notes (Signed)
  Subjective:    CC: Follow-up  HPI: Obesity: Entering the fourth month, currently on Topamax, acarbose, phentermine, only 2 pound weight loss since last month.  I reviewed the past medical history, family history, social history, surgical history, and allergies today and no changes were needed.  Please see the problem list section below in epic for further details.  Past Medical History: Past Medical History:  Diagnosis Date  . Asthma    Past Surgical History: Past Surgical History:  Procedure Laterality Date  . APPENDECTOMY     Social History: Social History   Socioeconomic History  . Marital status: Married    Spouse name: Not on file  . Number of children: Not on file  . Years of education: Not on file  . Highest education level: Not on file  Occupational History  . Not on file  Social Needs  . Financial resource strain: Not on file  . Food insecurity:    Worry: Not on file    Inability: Not on file  . Transportation needs:    Medical: Not on file    Non-medical: Not on file  Tobacco Use  . Smoking status: Former Smoker    Packs/day: 0.50    Types: Cigarettes  . Smokeless tobacco: Never Used  Substance and Sexual Activity  . Alcohol use: Yes    Alcohol/week: 0.0 oz  . Drug use: No  . Sexual activity: Not on file  Lifestyle  . Physical activity:    Days per week: Not on file    Minutes per session: Not on file  . Stress: Not on file  Relationships  . Social connections:    Talks on phone: Not on file    Gets together: Not on file    Attends religious service: Not on file    Active member of club or organization: Not on file    Attends meetings of clubs or organizations: Not on file    Relationship status: Not on file  Other Topics Concern  . Not on file  Social History Narrative  . Not on file   Family History: Family History  Problem Relation Age of Onset  . Diabetes Mother   . Hypertension Father    Allergies: Allergies  Allergen  Reactions  . Bee Venom Anaphylaxis   Medications: See med rec.  Review of Systems: No fevers, chills, night sweats, weight loss, chest pain, or shortness of breath.   Objective:    General: Well Developed, well nourished, and in no acute distress.  Neuro: Alert and oriented x3, extra-ocular muscles intact, sensation grossly intact.  HEENT: Normocephalic, atraumatic, pupils equal round reactive to light, neck supple, no masses, no lymphadenopathy, thyroid nonpalpable.  Skin: Warm and dry, no rashes. Cardiac: Regular rate and rhythm, no murmurs rubs or gallops, no lower extremity edema.  Respiratory: Clear to auscultation bilaterally. Not using accessory muscles, speaking in full sentences.  Impression and Recommendations:    Obesity Two-pound weight loss, continue Topamax, phentermine, acarbose. Entering the fourth month. ___________________________________________ Ihor Austin. Benjamin Stain, M.D., ABFM., CAQSM. Primary Care and Sports Medicine [facility]  Adjunct Instructor of Family Medicine  [employer]

## 2018-02-15 ENCOUNTER — Encounter: Payer: Self-pay | Admitting: Sports Medicine

## 2018-02-15 ENCOUNTER — Ambulatory Visit (INDEPENDENT_AMBULATORY_CARE_PROVIDER_SITE_OTHER): Payer: BLUE CROSS/BLUE SHIELD | Admitting: Sports Medicine

## 2018-02-15 ENCOUNTER — Ambulatory Visit: Payer: BLUE CROSS/BLUE SHIELD | Admitting: Sports Medicine

## 2018-02-15 DIAGNOSIS — M170 Bilateral primary osteoarthritis of knee: Secondary | ICD-10-CM | POA: Diagnosis not present

## 2018-02-15 DIAGNOSIS — M75121 Complete rotator cuff tear or rupture of right shoulder, not specified as traumatic: Secondary | ICD-10-CM | POA: Diagnosis not present

## 2018-02-15 DIAGNOSIS — E6609 Other obesity due to excess calories: Secondary | ICD-10-CM | POA: Diagnosis not present

## 2018-02-15 MED ORDER — INDOMETHACIN 50 MG PO CAPS: 50.0000 mg | ORAL_CAPSULE | Freq: Two times a day (BID) | ORAL | 1 refills | Status: DC

## 2018-02-15 MED ORDER — HYDROCHLOROTHIAZIDE 25 MG PO TABS: 12.5000 mg | ORAL_TABLET | Freq: Every day | ORAL | 1 refills | Status: DC

## 2018-02-15 MED ORDER — ACARBOSE 100 MG PO TABS: 100.0000 mg | ORAL_TABLET | Freq: Three times a day (TID) | ORAL | 1 refills | Status: DC

## 2018-02-15 MED ORDER — PHENTERMINE HCL 37.5 MG PO TABS: ORAL_TABLET | ORAL | 0 refills | Status: DC

## 2018-02-15 MED ORDER — TRAMADOL HCL 50 MG PO TABS: 100.0000 mg | ORAL_TABLET | Freq: Two times a day (BID) | ORAL | 0 refills | Status: DC

## 2018-02-15 MED ORDER — TOPIRAMATE 50 MG PO TABS: 50.0000 mg | ORAL_TABLET | Freq: Two times a day (BID) | ORAL | 0 refills | Status: DC

## 2018-02-15 MED ORDER — ALBUTEROL SULFATE 108 (90 BASE) MCG/ACT IN AEPB: 2.0000 | INHALATION_SPRAY | Freq: Four times a day (QID) | RESPIRATORY_TRACT | 11 refills | Status: DC | PRN

## 2018-02-15 NOTE — Assessment & Plan Note (Addendum)
10 pound weight loss after the last month, entering the fifth month. Continue Topamax, phentermine, acarbose. He has demonstrated good weight loss and discipline with medical therapy, I would like him to consider surgical therapy.  Referral placed.

## 2018-02-15 NOTE — Assessment & Plan Note (Signed)
Again, repeat injection today, I have again encouraged him to follow-up with Dr. Ave Filterhandler for shoulder arthroscopy.

## 2018-02-15 NOTE — Progress Notes (Signed)
Subjective:    CC: Weight check  HPI: Obesity: Reginia Fortsablo has lost 10 pounds.  Right shoulder pain: Known subacromial bursitis, rotator cuff tear, we have encouraged him to touch base with Dr. Ave Filterhandler for consideration of arthroscopic intervention, injections are not lasting but a month or 2.  He has not had an injection for 3 months, pain is severe, localized over the deltoid and worse with overhead activities and abduction.  I reviewed the past medical history, family history, social history, surgical history, and allergies today and no changes were needed.  Please see the problem list section below in epic for further details.  Past Medical History: Past Medical History:  Diagnosis Date  . Asthma    Past Surgical History: Past Surgical History:  Procedure Laterality Date  . APPENDECTOMY     Social History: Social History   Socioeconomic History  . Marital status: Married    Spouse name: Not on file  . Number of children: Not on file  . Years of education: Not on file  . Highest education level: Not on file  Occupational History  . Not on file  Social Needs  . Financial resource strain: Not on file  . Food insecurity:    Worry: Not on file    Inability: Not on file  . Transportation needs:    Medical: Not on file    Non-medical: Not on file  Tobacco Use  . Smoking status: Former Smoker    Packs/day: 0.50    Types: Cigarettes  . Smokeless tobacco: Never Used  Substance and Sexual Activity  . Alcohol use: Yes    Alcohol/week: 0.0 oz  . Drug use: No  . Sexual activity: Not on file  Lifestyle  . Physical activity:    Days per week: Not on file    Minutes per session: Not on file  . Stress: Not on file  Relationships  . Social connections:    Talks on phone: Not on file    Gets together: Not on file    Attends religious service: Not on file    Active member of club or organization: Not on file    Attends meetings of clubs or organizations: Not on file   Relationship status: Not on file  Other Topics Concern  . Not on file  Social History Narrative  . Not on file   Family History: Family History  Problem Relation Age of Onset  . Diabetes Mother   . Hypertension Father    Allergies: Allergies  Allergen Reactions  . Bee Venom Anaphylaxis   Medications: See med rec.  Review of Systems: No fevers, chills, night sweats, weight loss, chest pain, or shortness of breath.   Objective:    General: Well Developed, well nourished, and in no acute distress.  Neuro: Alert and oriented x3, extra-ocular muscles intact, sensation grossly intact.  HEENT: Normocephalic, atraumatic, pupils equal round reactive to light, neck supple, no masses, no lymphadenopathy, thyroid nonpalpable.  Skin: Warm and dry, no rashes. Cardiac: Regular rate and rhythm, no murmurs rubs or gallops, no lower extremity edema.  Respiratory: Clear to auscultation bilaterally. Not using accessory muscles, speaking in full sentences.  Procedure: Real-time Ultrasound Guided Injection of right subacromial bursa Device: GE Logiq E  Verbal informed consent obtained.  Time-out conducted.  Noted no overlying erythema, induration, or other signs of local infection.  Skin prepped in a sterile fashion.  Local anesthesia: Topical Ethyl chloride.  With sterile technique and under real time ultrasound guidance: 1  cc Kenalog 40, 1 cc lidocaine, 1 cc bupivacaine injected easily Completed without difficulty  Pain immediately resolved suggesting accurate placement of the medication.  Advised to call if fevers/chills, erythema, induration, drainage, or persistent bleeding.  Images permanently stored and available for review in the ultrasound unit.  Impression: Technically successful ultrasound guided injection.  Impression and Recommendations:    Rotator cuff tear, right Again, repeat injection today, I have again encouraged him to follow-up with Dr. Ave Filter for shoulder  arthroscopy.  Obesity 10 pound weight loss after the last month, entering the fifth month. Continue Topamax, phentermine, acarbose. He has demonstrated good weight loss and discipline with medical therapy, I would like him to consider surgical therapy.  Referral placed.  ___________________________________________ Ihor Austin. Benjamin Stain, M.D., ABFM., CAQSM. Primary Care and Sports Medicine [place] [facility]  Adjunct Instructor of Family Medicine  [employer]

## 2018-03-22 ENCOUNTER — Other Ambulatory Visit: Payer: Self-pay | Admitting: Sports Medicine

## 2018-03-22 DIAGNOSIS — E6609 Other obesity due to excess calories: Secondary | ICD-10-CM

## 2018-04-04 ENCOUNTER — Other Ambulatory Visit: Payer: Self-pay | Admitting: Sports Medicine

## 2018-04-04 DIAGNOSIS — M170 Bilateral primary osteoarthritis of knee: Secondary | ICD-10-CM

## 2018-06-01 ENCOUNTER — Other Ambulatory Visit: Payer: Self-pay | Admitting: Sports Medicine

## 2018-06-01 DIAGNOSIS — M170 Bilateral primary osteoarthritis of knee: Secondary | ICD-10-CM

## 2018-07-28 ENCOUNTER — Other Ambulatory Visit: Payer: Self-pay | Admitting: Sports Medicine

## 2018-07-28 DIAGNOSIS — M170 Bilateral primary osteoarthritis of knee: Secondary | ICD-10-CM

## 2018-08-31 ENCOUNTER — Other Ambulatory Visit: Payer: Self-pay | Admitting: Sports Medicine

## 2018-08-31 DIAGNOSIS — M170 Bilateral primary osteoarthritis of knee: Secondary | ICD-10-CM

## 2018-10-09 ENCOUNTER — Ambulatory Visit (INDEPENDENT_AMBULATORY_CARE_PROVIDER_SITE_OTHER): Payer: BLUE CROSS/BLUE SHIELD | Admitting: Sports Medicine

## 2018-10-09 ENCOUNTER — Encounter: Payer: Self-pay | Admitting: Sports Medicine

## 2018-10-09 DIAGNOSIS — M75121 Complete rotator cuff tear or rupture of right shoulder, not specified as traumatic: Secondary | ICD-10-CM

## 2018-10-09 DIAGNOSIS — M5416 Radiculopathy, lumbar region: Secondary | ICD-10-CM | POA: Diagnosis not present

## 2018-10-09 DIAGNOSIS — E6609 Other obesity due to excess calories: Secondary | ICD-10-CM

## 2018-10-09 DIAGNOSIS — M170 Bilateral primary osteoarthritis of knee: Secondary | ICD-10-CM

## 2018-10-09 NOTE — Assessment & Plan Note (Addendum)
Has demonstrated good weight loss and dyspnea with medical therapy, at this point I think he is a good candidate for bariatric surgery. Referral placed again. Checking routine labs, we have tried multiple times in the past but he has simply not going to get them done.

## 2018-10-09 NOTE — Assessment & Plan Note (Signed)
We have done several injections, I have encouraged him multiple times to follow-up with Dr. Ave Filter for shoulder arthroscopy and likely rotator cuff repair if able. The most recent MRI did show retracted tears of the subscapularis and supraspinatus. Placing the referral again. I did decline to give him an injection today in spite of his request.

## 2018-10-09 NOTE — Assessment & Plan Note (Signed)
Referral back to Dr. Laurian Brim.

## 2018-10-09 NOTE — Assessment & Plan Note (Signed)
Has been on high-dose tramadol, at this point he has insurance and he can now be a candidate for knee arthroplasty. I would like him to discuss this with Dr. Luiz BlareGraves.

## 2018-10-09 NOTE — Progress Notes (Signed)
Subjective:    CC: Multiple issues  HPI: Bilateral knee osteoarthritis: This point has failed nonoperative measures, he has insurance now and is agreeable to discuss this with 1 of our orthopedic surgeons.  Shoulder pain: Multi-tendon rotator cuff tear, now that he has insurance he is agreeable to discuss this with Dr. Ave Filterhandler.  Obesity: We have tried multiple medications, he is finally ready to consider sleeve gastrectomy.  I reviewed the past medical history, family history, social history, surgical history, and allergies today and no changes were needed.  Please see the problem list section below in epic for further details.  Past Medical History: Past Medical History:  Diagnosis Date  . Asthma    Past Surgical History: Past Surgical History:  Procedure Laterality Date  . APPENDECTOMY     Social History: Social History   Socioeconomic History  . Marital status: Married    Spouse name: Not on file  . Number of children: Not on file  . Years of education: Not on file  . Highest education level: Not on file  Occupational History  . Not on file  Social Needs  . Financial resource strain: Not on file  . Food insecurity:    Worry: Not on file    Inability: Not on file  . Transportation needs:    Medical: Not on file    Non-medical: Not on file  Tobacco Use  . Smoking status: Former Smoker    Packs/day: 0.50    Types: Cigarettes  . Smokeless tobacco: Never Used  Substance and Sexual Activity  . Alcohol use: Yes    Alcohol/week: 0.0 standard drinks  . Drug use: No  . Sexual activity: Not on file  Lifestyle  . Physical activity:    Days per week: Not on file    Minutes per session: Not on file  . Stress: Not on file  Relationships  . Social connections:    Talks on phone: Not on file    Gets together: Not on file    Attends religious service: Not on file    Active member of club or organization: Not on file    Attends meetings of clubs or organizations: Not  on file    Relationship status: Not on file  Other Topics Concern  . Not on file  Social History Narrative  . Not on file   Family History: Family History  Problem Relation Age of Onset  . Diabetes Mother   . Hypertension Father    Allergies: Allergies  Allergen Reactions  . Bee Venom Anaphylaxis   Medications: See med rec.  Review of Systems: No fevers, chills, night sweats, weight loss, chest pain, or shortness of breath.   Objective:    General: Well Developed, well nourished, and in no acute distress.  Neuro: Alert and oriented x3, extra-ocular muscles intact, sensation grossly intact.  HEENT: Normocephalic, atraumatic, pupils equal round reactive to light, neck supple, no masses, no lymphadenopathy, thyroid nonpalpable.  Skin: Warm and dry, no rashes. Cardiac: Regular rate and rhythm, no murmurs rubs or gallops, no lower extremity edema.  Respiratory: Clear to auscultation bilaterally. Not using accessory muscles, speaking in full sentences.  Impression and Recommendations:    Obesity Has demonstrated good weight loss and dyspnea with medical therapy, at this point I think he is a good candidate for bariatric surgery. Referral placed again. Checking routine labs, we have tried multiple times in the past but he has simply not going to get them done.  Rotator cuff  tear, right We have done several injections, I have encouraged him multiple times to follow-up with Dr. Ave Filter for shoulder arthroscopy and likely rotator cuff repair if able. The most recent MRI did show retracted tears of the subscapularis and supraspinatus. Placing the referral again. I did decline to give him an injection today in spite of his request.  Primary osteoarthritis of both knees Has been on high-dose tramadol, at this point he has insurance and he can now be a candidate for knee arthroplasty. I would like him to discuss this with Dr. Luiz Blare.  Bilateral lumbar radiculopathy Referral back  to Dr. Laurian Brim.  I spent 40 minutes with this patient, greater than 50% was face-to-face time counseling regarding the above diagnoses, specifically anticipatory guidance regarding rotator cuff tears, as well as obesity. ___________________________________________ Ihor Austin. Benjamin Stain, M.D., ABFM., CAQSM. Primary Care and Sports Medicine [place] [facility]  Adjunct Professor of Family Medicine  [employer]

## 2018-10-10 ENCOUNTER — Other Ambulatory Visit: Payer: Self-pay | Admitting: Sports Medicine

## 2018-10-10 DIAGNOSIS — M170 Bilateral primary osteoarthritis of knee: Secondary | ICD-10-CM

## 2018-10-11 ENCOUNTER — Other Ambulatory Visit: Payer: Self-pay | Admitting: Sports Medicine

## 2018-10-11 DIAGNOSIS — M170 Bilateral primary osteoarthritis of knee: Secondary | ICD-10-CM

## 2018-10-11 MED ORDER — TRAMADOL HCL 50 MG PO TABS: 100.0000 mg | ORAL_TABLET | Freq: Two times a day (BID) | ORAL | 0 refills | Status: DC

## 2018-10-12 LAB — HEMOGLOBIN A1C
Hgb A1c MFr Bld: 5 % of total Hgb (ref <5.7)
Mean Plasma Glucose: 97 (calc)
eAG (mmol/L): 5.4 (calc)

## 2018-10-12 LAB — COMPREHENSIVE METABOLIC PANEL
AG Ratio: 1.4 (calc) (ref 1.0–2.5)
ALT: 33 U/L (ref 9–46)
AST: 19 U/L (ref 10–40)
Albumin: 3.9 g/dL (ref 3.6–5.1)
Alkaline phosphatase (APISO): 70 U/L (ref 40–115)
BUN: 21 mg/dL (ref 7–25)
CO2: 24 mmol/L (ref 20–32)
Calcium: 9.3 mg/dL (ref 8.6–10.3)
Chloride: 109 mmol/L (ref 98–110)
Creat: 0.68 mg/dL (ref 0.60–1.35)
Globulin: 2.7 g/dL (calc) (ref 1.9–3.7)
Glucose, Bld: 107 mg/dL — ABNORMAL HIGH (ref 65–99)
Potassium: 4.5 mmol/L (ref 3.5–5.3)
Sodium: 140 mmol/L (ref 135–146)
Total Bilirubin: 0.3 mg/dL (ref 0.2–1.2)
Total Protein: 6.6 g/dL (ref 6.1–8.1)

## 2018-10-12 LAB — CBC
HCT: 41.2 % (ref 38.5–50.0)
Hemoglobin: 14.2 g/dL (ref 13.2–17.1)
MCH: 31.5 pg (ref 27.0–33.0)
MCHC: 34.5 g/dL (ref 32.0–36.0)
MCV: 91.4 fL (ref 80.0–100.0)
MPV: 10.3 fL (ref 7.5–12.5)
Platelets: 421 10*3/uL — ABNORMAL HIGH (ref 140–400)
RBC: 4.51 Million/uL (ref 4.20–5.80)
RDW: 12 % (ref 11.0–15.0)
WBC: 7.5 10*3/uL (ref 3.8–10.8)

## 2018-10-12 LAB — TESTOSTERONE, FREE & TOTAL
Free Testosterone: 49.1 pg/mL (ref 35.0–155.0)
Testosterone, Total, LC-MS-MS: 315 ng/dL (ref 250–1100)

## 2018-10-12 LAB — LIPID PANEL W/REFLEX DIRECT LDL
Cholesterol: 198 mg/dL (ref <200)
HDL: 59 mg/dL (ref >40)
LDL Cholesterol (Calc): 114 mg/dL (calc) — ABNORMAL HIGH
Non-HDL Cholesterol (Calc): 139 mg/dL (calc) — ABNORMAL HIGH (ref <130)
Total CHOL/HDL Ratio: 3.4 (calc) (ref <5.0)
Triglycerides: 130 mg/dL (ref <150)

## 2018-10-12 LAB — TSH: TSH: 1.31 mIU/L (ref 0.40–4.50)

## 2018-10-14 DIAGNOSIS — G894 Chronic pain syndrome: Secondary | ICD-10-CM | POA: Insufficient documentation

## 2018-11-01 ENCOUNTER — Other Ambulatory Visit: Payer: Self-pay | Admitting: Sports Medicine

## 2018-12-24 ENCOUNTER — Ambulatory Visit (INDEPENDENT_AMBULATORY_CARE_PROVIDER_SITE_OTHER): Payer: BLUE CROSS/BLUE SHIELD | Admitting: Sports Medicine

## 2018-12-24 DIAGNOSIS — J4520 Mild intermittent asthma, uncomplicated: Secondary | ICD-10-CM | POA: Diagnosis not present

## 2018-12-24 MED ORDER — ALBUTEROL SULFATE (2.5 MG/3ML) 0.083% IN NEBU: 2.5000 mg | INHALATION_SOLUTION | RESPIRATORY_TRACT | 6 refills | Status: DC | PRN

## 2018-12-24 NOTE — Assessment & Plan Note (Signed)
Unable to get any vitals. Unable to check pulse ox. Unable to listen to his lungs. Refilling albuterol, he needs to see me on Wednesday in person, unfortunately we have been cut down to seeing patients Monday, Wednesday, Friday, otherwise I would see him tomorrow morning.

## 2018-12-24 NOTE — Progress Notes (Signed)
Virtual Visit via Telephone   I connected with  Jonathan Villarreal  on 12/24/18 by telephone and verified that I am speaking with the correct person using two identifiers.   I discussed the limitations, risks, security and privacy concerns of performing an evaluation and management service by telephone, including the higher likelihood of inaccurate diagnosis and treatment, and the availability of in person appointments.  We also discussed the likely need of an additional face to face encounter for complete and high quality delivery of care.  I also discussed with the patient that there may be a patient responsible charge related to this service. The patient expressed understanding and wishes to proceed.  Subjective:    CC: Shortness of breath, chest pain  HPI: For the past several days this pleasant 48 year old male has had cough, wheezing, mild shortness of breath.  This was created as a virtual visit though it should be in person.  Symptoms are moderate, worsening.  He needs a refill on his albuterol.  No fevers, he does have some subjective chills, myalgias.  I reviewed the past medical history, family history, social history, surgical history, and allergies today and no changes were needed.  Please see the problem list section below in epic for further details.  Past Medical History: Past Medical History:  Diagnosis Date  . Asthma    Past Surgical History: Past Surgical History:  Procedure Laterality Date  . APPENDECTOMY     Social History: Social History   Socioeconomic History  . Marital status: Married    Spouse name: Not on file  . Number of children: Not on file  . Years of education: Not on file  . Highest education level: Not on file  Occupational History  . Not on file  Social Needs  . Financial resource strain: Not on file  . Food insecurity:    Worry: Not on file    Inability: Not on file  . Transportation needs:    Medical: Not on file    Non-medical: Not on file   Tobacco Use  . Smoking status: Former Smoker    Packs/day: 0.50    Types: Cigarettes  . Smokeless tobacco: Never Used  Substance and Sexual Activity  . Alcohol use: Yes    Alcohol/week: 0.0 standard drinks  . Drug use: No  . Sexual activity: Not on file  Lifestyle  . Physical activity:    Days per week: Not on file    Minutes per session: Not on file  . Stress: Not on file  Relationships  . Social connections:    Talks on phone: Not on file    Gets together: Not on file    Attends religious service: Not on file    Active member of club or organization: Not on file    Attends meetings of clubs or organizations: Not on file    Relationship status: Not on file  Other Topics Concern  . Not on file  Social History Narrative  . Not on file   Family History: Family History  Problem Relation Age of Onset  . Diabetes Mother   . Hypertension Father    Allergies: Allergies  Allergen Reactions  . Bee Venom Anaphylaxis   Medications: See med rec.  Review of Systems: No fevers, chills, night sweats, weight loss, chest pain, or shortness of breath.   Objective:    General: Speaking full sentences, no audible heavy breathing.  Sounds alert and appropriately interactive.  No other physical exam performed  due to the non-face to face nature of this visit.  Impression and Recommendations:    Asthma, mild intermittent Unable to get any vitals. Unable to check pulse ox. Unable to listen to his lungs. Refilling albuterol, he needs to see me on Wednesday in person, unfortunately we have been cut down to seeing patients Monday, Wednesday, Friday, otherwise I would see him tomorrow morning.  I discussed the above assessment and treatment plan with the patient. The patient was provided an opportunity to ask questions and all were answered. The patient agreed with the plan and demonstrated an understanding of the instructions.   The patient was advised to call back or seek an in-person  evaluation if the symptoms worsen or if the condition fails to improve as anticipated.   I provided 21 minutes of non-face-to-face time during this encounter, less than 50% of this was time needed to gather information, review chart, records, and complete documentation.   ___________________________________________ Ihor Austinhomas J. Benjamin Stainhekkekandam, M.D., ABFM., CAQSM. Primary Care and Sports Medicine [facility]  Adjunct Professor of Family Medicine  [employer]

## 2018-12-26 ENCOUNTER — Encounter: Payer: Self-pay | Admitting: Sports Medicine

## 2018-12-26 ENCOUNTER — Other Ambulatory Visit: Payer: Self-pay

## 2018-12-26 ENCOUNTER — Ambulatory Visit (INDEPENDENT_AMBULATORY_CARE_PROVIDER_SITE_OTHER): Payer: BLUE CROSS/BLUE SHIELD

## 2018-12-26 ENCOUNTER — Ambulatory Visit: Payer: BLUE CROSS/BLUE SHIELD | Admitting: Sports Medicine

## 2018-12-26 DIAGNOSIS — I10 Essential (primary) hypertension: Secondary | ICD-10-CM | POA: Diagnosis not present

## 2018-12-26 DIAGNOSIS — J4520 Mild intermittent asthma, uncomplicated: Secondary | ICD-10-CM | POA: Diagnosis not present

## 2018-12-26 MED ORDER — PREDNISONE 50 MG PO TABS: 50.0000 mg | ORAL_TABLET | Freq: Every day | ORAL | 0 refills | Status: DC

## 2018-12-26 MED ORDER — AZITHROMYCIN 250 MG PO TABS: ORAL_TABLET | ORAL | 0 refills | Status: DC

## 2018-12-26 MED ORDER — LISINOPRIL-HYDROCHLOROTHIAZIDE 20-12.5 MG PO TABS: 1.0000 | ORAL_TABLET | Freq: Every day | ORAL | 3 refills | Status: DC

## 2018-12-26 NOTE — Assessment & Plan Note (Signed)
Acute asthma exacerbation. Chest x-ray, prednisone, azithromycin. Return to see me if no better, continue nebulizer treatments as needed. No COVID-19 symptoms.

## 2018-12-26 NOTE — Assessment & Plan Note (Signed)
Blood pressure has consistently run borderline high over the past 2 years. Switching from hydrochlorothiazide to lisinopril/HCTZ combo, follow-up in 2 weeks for blood pressure check as well as to recheck his pulmonary illness.

## 2018-12-26 NOTE — Progress Notes (Signed)
Subjective:    CC: Coughing  HPI: This is a pleasant 48 year old male, we did a virtual visit for cough and shortness of breath, 2 days ago, he was unable to get me the vitals.  He was speaking full sentences on the phone.  He is back here for further evaluation, we called in some albuterol nebulizer which has improved his symptoms somewhat.  No fevers, chills, night sweats, weight loss, no GI symptoms, no skin rash.  Symptoms are moderate, persistent.  Benign essential hypertension: Only on hydrochlorothiazide monotherapy, blood pressure has been borderline for a while now, it is time to do something else.  No headaches, visual changes, chest pain.  I reviewed the past medical history, family history, social history, surgical history, and allergies today and no changes were needed.  Please see the problem list section below in epic for further details.  Past Medical History: Past Medical History:  Diagnosis Date  . Asthma    Past Surgical History: Past Surgical History:  Procedure Laterality Date  . APPENDECTOMY     Social History: Social History   Socioeconomic History  . Marital status: Married    Spouse name: Not on file  . Number of children: Not on file  . Years of education: Not on file  . Highest education level: Not on file  Occupational History  . Not on file  Social Needs  . Financial resource strain: Not on file  . Food insecurity:    Worry: Not on file    Inability: Not on file  . Transportation needs:    Medical: Not on file    Non-medical: Not on file  Tobacco Use  . Smoking status: Former Smoker    Packs/day: 0.50    Types: Cigarettes  . Smokeless tobacco: Never Used  Substance and Sexual Activity  . Alcohol use: Yes    Alcohol/week: 0.0 standard drinks  . Drug use: No  . Sexual activity: Not on file  Lifestyle  . Physical activity:    Days per week: Not on file    Minutes per session: Not on file  . Stress: Not on file  Relationships  .  Social connections:    Talks on phone: Not on file    Gets together: Not on file    Attends religious service: Not on file    Active member of club or organization: Not on file    Attends meetings of clubs or organizations: Not on file    Relationship status: Not on file  Other Topics Concern  . Not on file  Social History Narrative  . Not on file   Family History: Family History  Problem Relation Age of Onset  . Diabetes Mother   . Hypertension Father    Allergies: Allergies  Allergen Reactions  . Bee Venom Anaphylaxis   Medications: See med rec.  Review of Systems: No fevers, chills, night sweats, weight loss, chest pain, or shortness of breath.   Objective:    General: Well Developed, well nourished, and in no acute distress.  Neuro: Alert and oriented x3, extra-ocular muscles intact, sensation grossly intact.  HEENT: Normocephalic, atraumatic, pupils equal round reactive to light, neck supple, no masses, no lymphadenopathy, thyroid nonpalpable.  Oropharynx, nasopharynx, ear canals unremarkable. Skin: Warm and dry, no rashes. Cardiac: Regular rate and rhythm, no murmurs rubs or gallops, no lower extremity edema.  Respiratory: Coarse sounds throughout. Not using accessory muscles, speaking in full sentences.  Impression and Recommendations:    Asthma, mild  intermittent Acute asthma exacerbation. Chest x-ray, prednisone, azithromycin. Return to see me if no better, continue nebulizer treatments as needed. No COVID-19 symptoms.  Essential hypertension, benign Blood pressure has consistently run borderline high over the past 2 years. Switching from hydrochlorothiazide to lisinopril/HCTZ combo, follow-up in 2 weeks for blood pressure check as well as to recheck his pulmonary illness.   ___________________________________________ Ihor Austin. Benjamin Stain, M.D., ABFM., CAQSM. Primary Care and Sports Medicine [place] [facility]  Adjunct Professor of  Family Medicine  [employer]

## 2018-12-27 ENCOUNTER — Encounter: Payer: Self-pay | Admitting: Sports Medicine

## 2019-01-01 ENCOUNTER — Ambulatory Visit (INDEPENDENT_AMBULATORY_CARE_PROVIDER_SITE_OTHER): Payer: BLUE CROSS/BLUE SHIELD | Admitting: Sports Medicine

## 2019-01-01 ENCOUNTER — Encounter: Payer: Self-pay | Admitting: Sports Medicine

## 2019-01-01 DIAGNOSIS — M75121 Complete rotator cuff tear or rupture of right shoulder, not specified as traumatic: Secondary | ICD-10-CM | POA: Diagnosis not present

## 2019-01-01 NOTE — Assessment & Plan Note (Signed)
Patient does have rotator cuff repair coming up soon as the surgeons are cleared to perform the operation, he does have retracted tears of the subscapularis and supraspinatus. FMLA paperwork filled out today. Further FMLA paperwork to be completed by his surgeon.

## 2019-01-01 NOTE — Progress Notes (Signed)
  Subjective:    CC: Shoulder pain  HPI: Rotator cuff tears: Needs FMLA paperwork filled out, he is first in-line for rotator cuff repair.  I reviewed the past medical history, family history, social history, surgical history, and allergies today and no changes were needed.  Please see the problem list section below in epic for further details.  Past Medical History: Past Medical History:  Diagnosis Date  . Asthma    Past Surgical History: Past Surgical History:  Procedure Laterality Date  . APPENDECTOMY     Social History: Social History   Socioeconomic History  . Marital status: Married    Spouse name: Not on file  . Number of children: Not on file  . Years of education: Not on file  . Highest education level: Not on file  Occupational History  . Not on file  Social Needs  . Financial resource strain: Not on file  . Food insecurity:    Worry: Not on file    Inability: Not on file  . Transportation needs:    Medical: Not on file    Non-medical: Not on file  Tobacco Use  . Smoking status: Former Smoker    Packs/day: 0.50    Types: Cigarettes  . Smokeless tobacco: Never Used  Substance and Sexual Activity  . Alcohol use: Yes    Alcohol/week: 0.0 standard drinks  . Drug use: No  . Sexual activity: Not on file  Lifestyle  . Physical activity:    Days per week: Not on file    Minutes per session: Not on file  . Stress: Not on file  Relationships  . Social connections:    Talks on phone: Not on file    Gets together: Not on file    Attends religious service: Not on file    Active member of club or organization: Not on file    Attends meetings of clubs or organizations: Not on file    Relationship status: Not on file  Other Topics Concern  . Not on file  Social History Narrative  . Not on file   Family History: Family History  Problem Relation Age of Onset  . Diabetes Mother   . Hypertension Father    Allergies: Allergies  Allergen Reactions  . Bee  Venom Anaphylaxis   Medications: See med rec.  Review of Systems: No fevers, chills, night sweats, weight loss, chest pain, or shortness of breath.   Objective:    General: Well Developed, well nourished, and in no acute distress.  Neuro: Alert and oriented x3, extra-ocular muscles intact, sensation grossly intact.  HEENT: Normocephalic, atraumatic, pupils equal round reactive to light, neck supple, no masses, no lymphadenopathy, thyroid nonpalpable.  Skin: Warm and dry, no rashes. Cardiac: Regular rate and rhythm, no murmurs rubs or gallops, no lower extremity edema.  Respiratory: Clear to auscultation bilaterally. Not using accessory muscles, speaking in full sentences.  Impression and Recommendations:    Rotator cuff tear, right Patient does have rotator cuff repair coming up soon as the surgeons are cleared to perform the operation, he does have retracted tears of the subscapularis and supraspinatus. FMLA paperwork filled out today. Further FMLA paperwork to be completed by his surgeon.   ___________________________________________ Ihor Austin. Benjamin Stain, M.D., ABFM., CAQSM. Primary Care and Sports Medicine [place] [facility]  Adjunct Professor of Family Medicine  [employer]

## 2019-01-08 ENCOUNTER — Encounter: Payer: Self-pay | Admitting: Sports Medicine

## 2019-01-08 ENCOUNTER — Ambulatory Visit (INDEPENDENT_AMBULATORY_CARE_PROVIDER_SITE_OTHER): Payer: BLUE CROSS/BLUE SHIELD | Admitting: Sports Medicine

## 2019-01-08 DIAGNOSIS — I10 Essential (primary) hypertension: Secondary | ICD-10-CM

## 2019-01-08 MED ORDER — LISINOPRIL-HYDROCHLOROTHIAZIDE 20-25 MG PO TABS: 1.0000 | ORAL_TABLET | Freq: Every day | ORAL | 5 refills | Status: DC

## 2019-01-08 NOTE — Progress Notes (Signed)
  Subjective:    CC: Hypertension  HPI: This is a pleasant 48 year old male, we have been treating him for hypertension, no headaches, visual changes, chest pain.  Compliant with medications.  I reviewed the past medical history, family history, social history, surgical history, and allergies today and no changes were needed.  Please see the problem list section below in epic for further details.  Past Medical History: Past Medical History:  Diagnosis Date  . Asthma    Past Surgical History: Past Surgical History:  Procedure Laterality Date  . APPENDECTOMY     Social History: Social History   Socioeconomic History  . Marital status: Married    Spouse name: Not on file  . Number of children: Not on file  . Years of education: Not on file  . Highest education level: Not on file  Occupational History  . Not on file  Social Needs  . Financial resource strain: Not on file  . Food insecurity:    Worry: Not on file    Inability: Not on file  . Transportation needs:    Medical: Not on file    Non-medical: Not on file  Tobacco Use  . Smoking status: Former Smoker    Packs/day: 0.50    Types: Cigarettes  . Smokeless tobacco: Never Used  Substance and Sexual Activity  . Alcohol use: Yes    Alcohol/week: 0.0 standard drinks  . Drug use: No  . Sexual activity: Not on file  Lifestyle  . Physical activity:    Days per week: Not on file    Minutes per session: Not on file  . Stress: Not on file  Relationships  . Social connections:    Talks on phone: Not on file    Gets together: Not on file    Attends religious service: Not on file    Active member of club or organization: Not on file    Attends meetings of clubs or organizations: Not on file    Relationship status: Not on file  Other Topics Concern  . Not on file  Social History Narrative  . Not on file   Family History: Family History  Problem Relation Age of Onset  . Diabetes Mother   . Hypertension Father     Allergies: Allergies  Allergen Reactions  . Bee Venom Anaphylaxis   Medications: See med rec.  Review of Systems: No fevers, chills, night sweats, weight loss, chest pain, or shortness of breath.   Objective:    General: Well Developed, well nourished, and in no acute distress.  Neuro: Alert and oriented x3, extra-ocular muscles intact, sensation grossly intact.  HEENT: Normocephalic, atraumatic, pupils equal round reactive to light, neck supple, no masses, no lymphadenopathy, thyroid nonpalpable.  Skin: Warm and dry, no rashes. Cardiac: Regular rate and rhythm, no murmurs rubs or gallops, no lower extremity edema.  Respiratory: Clear to auscultation bilaterally. Not using accessory muscles, speaking in full sentences.  Impression and Recommendations:    Essential hypertension, benign Persistently elevated, increasing lisinopril/HCTZ to 20/25 mg. Follow-up in 2 weeks for a blood pressure recheck.   ___________________________________________ Ihor Austin. Benjamin Stain, M.D., ABFM., CAQSM. Primary Care and Sports Medicine [place] [facility]  Adjunct Professor of Family Medicine  [employer]

## 2019-01-08 NOTE — Assessment & Plan Note (Signed)
Persistently elevated, increasing lisinopril/HCTZ to 20/25 mg. Follow-up in 2 weeks for a blood pressure recheck.

## 2019-01-09 ENCOUNTER — Ambulatory Visit: Payer: BLUE CROSS/BLUE SHIELD | Admitting: Sports Medicine

## 2019-01-10 ENCOUNTER — Encounter: Payer: Self-pay | Admitting: Sports Medicine

## 2019-01-22 ENCOUNTER — Ambulatory Visit: Payer: BLUE CROSS/BLUE SHIELD | Admitting: Sports Medicine

## 2019-01-24 ENCOUNTER — Encounter: Payer: Self-pay | Admitting: Sports Medicine

## 2019-02-15 ENCOUNTER — Encounter: Payer: Self-pay | Admitting: Sports Medicine

## 2019-02-15 ENCOUNTER — Ambulatory Visit (INDEPENDENT_AMBULATORY_CARE_PROVIDER_SITE_OTHER): Payer: BLUE CROSS/BLUE SHIELD | Admitting: Sports Medicine

## 2019-02-15 DIAGNOSIS — E8881 Metabolic syndrome: Secondary | ICD-10-CM

## 2019-02-15 DIAGNOSIS — J4520 Mild intermittent asthma, uncomplicated: Secondary | ICD-10-CM

## 2019-02-15 MED ORDER — NEBULIZER AIR TUBE/PLUGS MISC: 0 refills | Status: DC

## 2019-02-15 MED ORDER — SEMAGLUTIDE(0.25 OR 0.5MG/DOS) 2 MG/1.5ML ~~LOC~~ SOPN: 1.0000 | PEN_INJECTOR | SUBCUTANEOUS | 1 refills | Status: DC

## 2019-02-15 MED ORDER — PHENTERMINE HCL 37.5 MG PO TABS: ORAL_TABLET | ORAL | 0 refills | Status: DC

## 2019-02-15 MED ORDER — SEMAGLUTIDE (1 MG/DOSE) 2 MG/1.5ML ~~LOC~~ SOPN: 1.0000 mg | PEN_INJECTOR | SUBCUTANEOUS | 11 refills | Status: DC

## 2019-02-15 NOTE — Assessment & Plan Note (Signed)
Adding Ozempic, phentermine, historical metformin. Return in 1 month to evaluate tolerance.

## 2019-02-15 NOTE — Patient Instructions (Signed)
DOWNLOAD OZEMPIC COUPON [employer]

## 2019-02-15 NOTE — Progress Notes (Signed)
  Subjective:    CC: Weight loss  HPI: Oxford is a pleasant 48 year old male, he is interested in sleeve gastrectomy, he is however going to be having his shoulder operated on, he has been sitting at home, not very active and is gaining a great deal of weight.  He has metabolic syndrome with elevated blood pressure, dyslipidemia, glucose intolerance, and he is agreeable to try Ozempic for this.  I reviewed the past medical history, family history, social history, surgical history, and allergies today and no changes were needed.  Please see the problem list section below in epic for further details.  Past Medical History: Past Medical History:  Diagnosis Date  . Asthma    Past Surgical History: Past Surgical History:  Procedure Laterality Date  . APPENDECTOMY     Social History: Social History   Socioeconomic History  . Marital status: Married    Spouse name: Not on file  . Number of children: Not on file  . Years of education: Not on file  . Highest education level: Not on file  Occupational History  . Not on file  Social Needs  . Financial resource strain: Not on file  . Food insecurity:    Worry: Not on file    Inability: Not on file  . Transportation needs:    Medical: Not on file    Non-medical: Not on file  Tobacco Use  . Smoking status: Former Smoker    Packs/day: 0.50    Types: Cigarettes  . Smokeless tobacco: Never Used  Substance and Sexual Activity  . Alcohol use: Yes    Alcohol/week: 0.0 standard drinks  . Drug use: No  . Sexual activity: Not on file  Lifestyle  . Physical activity:    Days per week: Not on file    Minutes per session: Not on file  . Stress: Not on file  Relationships  . Social connections:    Talks on phone: Not on file    Gets together: Not on file    Attends religious service: Not on file    Active member of club or organization: Not on file    Attends meetings of clubs or organizations: Not on file    Relationship status: Not  on file  Other Topics Concern  . Not on file  Social History Narrative  . Not on file   Family History: Family History  Problem Relation Age of Onset  . Diabetes Mother   . Hypertension Father    Allergies: Allergies  Allergen Reactions  . Bee Venom Anaphylaxis   Medications: See med rec.  Review of Systems: No fevers, chills, night sweats, weight loss, chest pain, or shortness of breath.   Objective:    General: Well Developed, well nourished, and in no acute distress.  Neuro: Alert and oriented x3, extra-ocular muscles intact, sensation grossly intact.  HEENT: Normocephalic, atraumatic, pupils equal round reactive to light, neck supple, no masses, no lymphadenopathy, thyroid nonpalpable.  Skin: Warm and dry, no rashes. Cardiac: Regular rate and rhythm, no murmurs rubs or gallops, no lower extremity edema.  Respiratory: Clear to auscultation bilaterally. Not using accessory muscles, speaking in full sentences.  Impression and Recommendations:    Metabolic syndrome Adding Ozempic, phentermine, historical metformin. Return in 1 month to evaluate tolerance.   ___________________________________________ Ihor Austin. Benjamin Stain, M.D., ABFM., CAQSM. Primary Care and Sports Medicine [place] [facility]  Adjunct Professor of Family Medicine  [employer]

## 2019-02-20 ENCOUNTER — Encounter: Payer: Self-pay | Admitting: Sports Medicine

## 2019-02-20 DIAGNOSIS — J4520 Mild intermittent asthma, uncomplicated: Secondary | ICD-10-CM

## 2019-02-22 MED ORDER — NEBULIZER AIR TUBE/PLUGS MISC: 0 refills | Status: DC

## 2019-02-27 ENCOUNTER — Other Ambulatory Visit: Payer: Self-pay | Admitting: *Deleted

## 2019-02-27 DIAGNOSIS — J4520 Mild intermittent asthma, uncomplicated: Secondary | ICD-10-CM

## 2019-02-27 MED ORDER — NEBULIZER AIR TUBE/PLUGS MISC: 0 refills | Status: DC

## 2019-03-08 ENCOUNTER — Other Ambulatory Visit: Payer: Self-pay | Admitting: Sports Medicine

## 2019-03-11 ENCOUNTER — Encounter: Payer: Self-pay | Admitting: Sports Medicine

## 2019-03-11 DIAGNOSIS — L02411 Cutaneous abscess of right axilla: Secondary | ICD-10-CM | POA: Insufficient documentation

## 2019-03-11 MED ORDER — DOXYCYCLINE HYCLATE 100 MG PO TABS: 100.0000 mg | ORAL_TABLET | Freq: Two times a day (BID) | ORAL | 0 refills | Status: AC

## 2019-03-11 NOTE — Assessment & Plan Note (Signed)
Doxycycline, see me in a week if no better.

## 2019-03-14 ENCOUNTER — Encounter: Payer: Self-pay | Admitting: Sports Medicine

## 2019-03-18 ENCOUNTER — Ambulatory Visit: Payer: BLUE CROSS/BLUE SHIELD | Admitting: Sports Medicine

## 2019-03-26 ENCOUNTER — Encounter: Payer: Self-pay | Admitting: Sports Medicine

## 2019-04-05 ENCOUNTER — Ambulatory Visit: Payer: BLUE CROSS/BLUE SHIELD | Admitting: Sports Medicine

## 2019-04-18 ENCOUNTER — Ambulatory Visit (INDEPENDENT_AMBULATORY_CARE_PROVIDER_SITE_OTHER): Payer: BLUE CROSS/BLUE SHIELD

## 2019-04-18 ENCOUNTER — Encounter: Payer: Self-pay | Admitting: Sports Medicine

## 2019-04-18 ENCOUNTER — Ambulatory Visit (INDEPENDENT_AMBULATORY_CARE_PROVIDER_SITE_OTHER): Payer: BLUE CROSS/BLUE SHIELD | Admitting: Sports Medicine

## 2019-04-18 ENCOUNTER — Other Ambulatory Visit: Payer: Self-pay

## 2019-04-18 DIAGNOSIS — R079 Chest pain, unspecified: Secondary | ICD-10-CM | POA: Insufficient documentation

## 2019-04-18 DIAGNOSIS — N529 Male erectile dysfunction, unspecified: Secondary | ICD-10-CM | POA: Diagnosis not present

## 2019-04-18 DIAGNOSIS — E6609 Other obesity due to excess calories: Secondary | ICD-10-CM | POA: Diagnosis not present

## 2019-04-18 LAB — COMPLETE METABOLIC PANEL WITH GFR
AG Ratio: 1.4 (calc) (ref 1.0–2.5)
ALT: 37 U/L (ref 9–46)
AST: 17 U/L (ref 10–40)
Albumin: 4.2 g/dL (ref 3.6–5.1)
Alkaline phosphatase (APISO): 65 U/L (ref 36–130)
BUN: 12 mg/dL (ref 7–25)
CO2: 29 mmol/L (ref 20–32)
Calcium: 9.3 mg/dL (ref 8.6–10.3)
Chloride: 98 mmol/L (ref 98–110)
Creat: 0.6 mg/dL (ref 0.60–1.35)
GFR, Est African American: 140 mL/min/{1.73_m2} (ref >=60)
GFR, Est Non African American: 121 mL/min/{1.73_m2} (ref >=60)
Globulin: 2.9 g/dL (calc) (ref 1.9–3.7)
Glucose, Bld: 99 mg/dL (ref 65–99)
Potassium: 3.9 mmol/L (ref 3.5–5.3)
Sodium: 135 mmol/L (ref 135–146)
Total Bilirubin: 0.8 mg/dL (ref 0.2–1.2)
Total Protein: 7.1 g/dL (ref 6.1–8.1)

## 2019-04-18 LAB — CBC WITH DIFFERENTIAL/PLATELET
Absolute Monocytes: 1168 cells/uL — ABNORMAL HIGH (ref 200–950)
Basophils Absolute: 51 cells/uL (ref 0–200)
Basophils Relative: 0.4 %
Eosinophils Absolute: 114 cells/uL (ref 15–500)
Eosinophils Relative: 0.9 %
HCT: 38.1 % — ABNORMAL LOW (ref 38.5–50.0)
Hemoglobin: 13.1 g/dL — ABNORMAL LOW (ref 13.2–17.1)
Lymphs Abs: 1740 cells/uL (ref 850–3900)
MCH: 31.3 pg (ref 27.0–33.0)
MCHC: 34.4 g/dL (ref 32.0–36.0)
MCV: 90.9 fL (ref 80.0–100.0)
MPV: 9.3 fL (ref 7.5–12.5)
Monocytes Relative: 9.2 %
Neutro Abs: 9627 cells/uL — ABNORMAL HIGH (ref 1500–7800)
Neutrophils Relative %: 75.8 %
Platelets: 361 10*3/uL (ref 140–400)
RBC: 4.19 10*6/uL — ABNORMAL LOW (ref 4.20–5.80)
RDW: 12.6 % (ref 11.0–15.0)
Total Lymphocyte: 13.7 %
WBC: 12.7 10*3/uL — ABNORMAL HIGH (ref 3.8–10.8)

## 2019-04-18 LAB — D-DIMER, QUANTITATIVE: D-Dimer, Quant: 1.65 mcg/mL FEU — ABNORMAL HIGH (ref <0.50)

## 2019-04-18 MED ORDER — IOHEXOL 350 MG/ML SOLN
100.0000 mL | Freq: Once | INTRAVENOUS | Status: AC | PRN
  Administered 2019-04-18: 100 mL via INTRAVENOUS

## 2019-04-18 MED ORDER — AMBULATORY NON FORMULARY MEDICATION: 0 refills | Status: DC

## 2019-04-18 MED ORDER — SILDENAFIL CITRATE 25 MG PO TABS: 25.0000 mg | ORAL_TABLET | Freq: Every day | ORAL | 0 refills | Status: DC | PRN

## 2019-04-18 NOTE — Assessment & Plan Note (Signed)
Discontinue phentermine, he is getting some erectile dysfunction. Increase Ozempic to full dose. He has had some good weight loss. We can recheck this in 90 days.

## 2019-04-18 NOTE — Assessment & Plan Note (Signed)
Starting low-dose Viagra.

## 2019-04-18 NOTE — Progress Notes (Addendum)
Subjective:    CC: Several problems  HPI: Chest pain: Recent rotator cuff repair, now with substernal left-sided pleuritic chest pain, nonexertional but worse with taking deep breaths.  No leg swelling.  Obesity: Good weight loss with Ozempic.  Erectile dysfunction: Not much better with sildenafil, he is not sure if this started with his phentermine.  I reviewed the past medical history, family history, social history, surgical history, and allergies today and no changes were needed.  Please see the problem list section below in epic for further details.  Past Medical History: Past Medical History:  Diagnosis Date  . Asthma    Past Surgical History: Past Surgical History:  Procedure Laterality Date  . APPENDECTOMY     Social History: Social History   Socioeconomic History  . Marital status: Married    Spouse name: Not on file  . Number of children: Not on file  . Years of education: Not on file  . Highest education level: Not on file  Occupational History  . Not on file  Social Needs  . Financial resource strain: Not on file  . Food insecurity    Worry: Not on file    Inability: Not on file  . Transportation needs    Medical: Not on file    Non-medical: Not on file  Tobacco Use  . Smoking status: Former Smoker    Packs/day: 0.50    Types: Cigarettes  . Smokeless tobacco: Never Used  Substance and Sexual Activity  . Alcohol use: Yes    Alcohol/week: 0.0 standard drinks  . Drug use: No  . Sexual activity: Not on file  Lifestyle  . Physical activity    Days per week: Not on file    Minutes per session: Not on file  . Stress: Not on file  Relationships  . Social Musicianconnections    Talks on phone: Not on file    Gets together: Not on file    Attends religious service: Not on file    Active member of club or organization: Not on file    Attends meetings of clubs or organizations: Not on file    Relationship status: Not on file  Other Topics Concern  . Not on  file  Social History Narrative  . Not on file   Family History: Family History  Problem Relation Age of Onset  . Diabetes Mother   . Hypertension Father    Allergies: Allergies  Allergen Reactions  . Bee Venom Anaphylaxis   Medications: See med rec.  Review of Systems: No fevers, chills, night sweats, weight loss, chest pain, or shortness of breath.   Objective:    General: Well Developed, well nourished, and in no acute distress.  Neuro: Alert and oriented x3, extra-ocular muscles intact, sensation grossly intact.  HEENT: Normocephalic, atraumatic, pupils equal round reactive to light, neck supple, no masses, no lymphadenopathy, thyroid nonpalpable.  Skin: Warm and dry, no rashes. Cardiac: Regular rate and rhythm, no murmurs rubs or gallops, no lower extremity edema.  Respiratory: Clear to auscultation bilaterally. Not using accessory muscles, speaking in full sentences.  Impression and Recommendations:    Obesity Discontinue phentermine, he is getting some erectile dysfunction. Increase Ozempic to full dose. He has had some good weight loss. We can recheck this in 90 days.  Chest pain Patient just had a rotator cuff repair, now with shortness of breath and chest pain that is pleuritic. Chest x-ray, stat d-dimer.  Elevated d-dimer, pleuritic chest pain, normal renal function, bring  him back for stat CT angiogram of the pulmonary arteries.  No definitive PE, but there is atelectasis.  Adding incentive spirometry.  Discussed with patient.  Erectile dysfunction Starting low-dose Viagra.   ___________________________________________ Gwen Her. Dianah Field, M.D., ABFM., CAQSM. Primary Care and Sports Medicine [place] [facility]  Adjunct Professor of [place] [employer] [facility] [employer]

## 2019-04-18 NOTE — Addendum Note (Signed)
Addended by: Silverio Decamp on: 04/18/2019 02:56 PM   Modules accepted: Orders

## 2019-04-18 NOTE — Assessment & Plan Note (Addendum)
Patient just had a rotator cuff repair, now with shortness of breath and chest pain that is pleuritic. Chest x-ray, stat d-dimer.  Elevated d-dimer, pleuritic chest pain, normal renal function, bring him back for stat CT angiogram of the pulmonary arteries.  No definitive PE, but there is atelectasis.  Adding incentive spirometry.  Discussed with patient.

## 2019-04-18 NOTE — Addendum Note (Signed)
Addended by: Silverio Decamp on: 04/18/2019 12:58 PM   Modules accepted: Orders

## 2019-04-19 ENCOUNTER — Encounter: Payer: Self-pay | Admitting: Sports Medicine

## 2019-04-19 DIAGNOSIS — R079 Chest pain, unspecified: Secondary | ICD-10-CM

## 2019-04-19 MED ORDER — AMBULATORY NON FORMULARY MEDICATION: 0 refills | Status: AC

## 2019-05-13 DIAGNOSIS — N200 Calculus of kidney: Secondary | ICD-10-CM | POA: Insufficient documentation

## 2019-05-15 ENCOUNTER — Encounter: Payer: Self-pay | Admitting: Sports Medicine

## 2019-05-15 DIAGNOSIS — L089 Local infection of the skin and subcutaneous tissue, unspecified: Secondary | ICD-10-CM | POA: Insufficient documentation

## 2019-05-15 MED ORDER — DOXYCYCLINE HYCLATE 100 MG PO TABS: 100.0000 mg | ORAL_TABLET | Freq: Two times a day (BID) | ORAL | 0 refills | Status: AC

## 2019-05-24 ENCOUNTER — Encounter: Payer: Self-pay | Admitting: Sports Medicine

## 2019-05-24 DIAGNOSIS — E8881 Metabolic syndrome: Secondary | ICD-10-CM

## 2019-05-24 MED ORDER — OZEMPIC (1 MG/DOSE) 2 MG/1.5ML ~~LOC~~ SOPN: 1.0000 mg | PEN_INJECTOR | SUBCUTANEOUS | 11 refills | Status: DC

## 2019-06-15 ENCOUNTER — Other Ambulatory Visit: Payer: Self-pay | Admitting: Sports Medicine

## 2019-06-15 DIAGNOSIS — N529 Male erectile dysfunction, unspecified: Secondary | ICD-10-CM

## 2019-07-01 ENCOUNTER — Other Ambulatory Visit: Payer: Self-pay

## 2019-07-01 ENCOUNTER — Ambulatory Visit (INDEPENDENT_AMBULATORY_CARE_PROVIDER_SITE_OTHER): Payer: BLUE CROSS/BLUE SHIELD | Admitting: Sports Medicine

## 2019-07-01 DIAGNOSIS — M170 Bilateral primary osteoarthritis of knee: Secondary | ICD-10-CM | POA: Diagnosis not present

## 2019-07-01 NOTE — Progress Notes (Signed)
Subjective:    CC: Right knee pain  HPI: Jonathan Villarreal is a pleasant 48 year old male, he has right knee pain, he has had several injections, the last of which was several years ago.  Now having recurrence of pain, moderate, persistent, localized at the joint line without radiation.  I reviewed the past medical history, family history, social history, surgical history, and allergies today and no changes were needed.  Please see the problem list section below in epic for further details.  Past Medical History: Past Medical History:  Diagnosis Date  . Asthma    Past Surgical History: Past Surgical History:  Procedure Laterality Date  . APPENDECTOMY     Social History: Social History   Socioeconomic History  . Marital status: Married    Spouse name: Not on file  . Number of children: Not on file  . Years of education: Not on file  . Highest education level: Not on file  Occupational History  . Not on file  Social Needs  . Financial resource strain: Not on file  . Food insecurity    Worry: Not on file    Inability: Not on file  . Transportation needs    Medical: Not on file    Non-medical: Not on file  Tobacco Use  . Smoking status: Former Smoker    Packs/day: 0.50    Types: Cigarettes  . Smokeless tobacco: Never Used  Substance and Sexual Activity  . Alcohol use: Yes    Alcohol/week: 0.0 standard drinks  . Drug use: No  . Sexual activity: Not on file  Lifestyle  . Physical activity    Days per week: Not on file    Minutes per session: Not on file  . Stress: Not on file  Relationships  . Social Herbalist on phone: Not on file    Gets together: Not on file    Attends religious service: Not on file    Active member of club or organization: Not on file    Attends meetings of clubs or organizations: Not on file    Relationship status: Not on file  Other Topics Concern  . Not on file  Social History Narrative  . Not on file   Family History: Family  History  Problem Relation Age of Onset  . Diabetes Mother   . Hypertension Father    Allergies: Allergies  Allergen Reactions  . Bee Venom Anaphylaxis   Medications: See med rec.  Review of Systems: No fevers, chills, night sweats, weight loss, chest pain, or shortness of breath.   Objective:    General: Well Developed, well nourished, and in no acute distress.  Neuro: Alert and oriented x3, extra-ocular muscles intact, sensation grossly intact.  HEENT: Normocephalic, atraumatic, pupils equal round reactive to light, neck supple, no masses, no lymphadenopathy, thyroid nonpalpable.  Skin: Warm and dry, no rashes. Cardiac: Regular rate and rhythm, no murmurs rubs or gallops, no lower extremity edema.  Respiratory: Clear to auscultation bilaterally. Not using accessory muscles, speaking in full sentences. Right knee: Minimally swollen, tender at the joint lines ROM normal in flexion and extension and lower leg rotation. Ligaments with solid consistent endpoints including ACL, PCL, LCL, MCL. Negative Mcmurray's and provocative meniscal tests. Non painful patellar compression. Patellar and quadriceps tendons unremarkable. Hamstring and quadriceps strength is normal.  Procedure: Real-time Ultrasound Guided injection of the right knee Device: GE Logiq E  Verbal informed consent obtained.  Time-out conducted.  Noted no overlying erythema, induration, or  other signs of local infection.  Skin prepped in a sterile fashion.  Local anesthesia: Topical Ethyl chloride.  With sterile technique and under real time ultrasound guidance:  1 cc Kenalog 40, 2 cc lidocaine, 2 cc bupivacaine injected easily Completed without difficulty  Pain immediately resolved suggesting accurate placement of the medication.  Advised to call if fevers/chills, erythema, induration, drainage, or persistent bleeding.  Images permanently stored and available for review in the ultrasound unit.  Impression:  Technically successful ultrasound guided injection.  Impression and Recommendations:    Primary osteoarthritis of both knees Right knee injection as above, previous injection was years ago.   ___________________________________________ Ihor Austin. Benjamin Stain, M.D., ABFM., CAQSM. Primary Care and Sports Medicine [place] [facility]  Adjunct Professor of Family Medicine  [employer] [facility]

## 2019-07-01 NOTE — Assessment & Plan Note (Signed)
Right knee injection as above, previous injection was years ago.

## 2019-07-03 ENCOUNTER — Other Ambulatory Visit: Payer: Self-pay | Admitting: Sports Medicine

## 2019-07-03 DIAGNOSIS — I10 Essential (primary) hypertension: Secondary | ICD-10-CM

## 2019-07-19 ENCOUNTER — Other Ambulatory Visit: Payer: Self-pay

## 2019-07-19 ENCOUNTER — Ambulatory Visit (INDEPENDENT_AMBULATORY_CARE_PROVIDER_SITE_OTHER): Payer: BLUE CROSS/BLUE SHIELD | Admitting: Sports Medicine

## 2019-07-19 ENCOUNTER — Encounter: Payer: Self-pay | Admitting: Sports Medicine

## 2019-07-19 DIAGNOSIS — E6609 Other obesity due to excess calories: Secondary | ICD-10-CM

## 2019-07-19 NOTE — Assessment & Plan Note (Signed)
We stopped phentermine due to erectile dysfunction. We continue Ozempic full dose, he is lost about 10 pounds. Unfortunately weight loss has plateaued, he will continue to do Ozempic full dose, he will also continue to cut out carbs but I think at this point we are going to refer him to bariatric surgery.

## 2019-07-19 NOTE — Progress Notes (Signed)
Subjective:    CC: Obesity  HPI: Jonathan Villarreal returns, he is a pleasant 48 year old male, he has lost about 10 pounds with Ozempic over 3 months but this is not enough.  We have tried multiple weight loss medications in the past, we have tried low calorie and low carbohydrate diets, all without much success.  He has multiple arthritic processes that are secondary to his weight.  I reviewed the past medical history, family history, social history, surgical history, and allergies today and no changes were needed.  Please see the problem list section below in epic for further details.  Past Medical History: Past Medical History:  Diagnosis Date  . Asthma    Past Surgical History: Past Surgical History:  Procedure Laterality Date  . APPENDECTOMY     Social History: Social History   Socioeconomic History  . Marital status: Married    Spouse name: Not on file  . Number of children: Not on file  . Years of education: Not on file  . Highest education level: Not on file  Occupational History  . Not on file  Social Needs  . Financial resource strain: Not on file  . Food insecurity    Worry: Not on file    Inability: Not on file  . Transportation needs    Medical: Not on file    Non-medical: Not on file  Tobacco Use  . Smoking status: Former Smoker    Packs/day: 0.50    Types: Cigarettes  . Smokeless tobacco: Never Used  Substance and Sexual Activity  . Alcohol use: Yes    Alcohol/week: 0.0 standard drinks  . Drug use: No  . Sexual activity: Not on file  Lifestyle  . Physical activity    Days per week: Not on file    Minutes per session: Not on file  . Stress: Not on file  Relationships  . Social Musician on phone: Not on file    Gets together: Not on file    Attends religious service: Not on file    Active member of club or organization: Not on file    Attends meetings of clubs or organizations: Not on file    Relationship status: Not on file  Other Topics  Concern  . Not on file  Social History Narrative  . Not on file   Family History: Family History  Problem Relation Age of Onset  . Diabetes Mother   . Hypertension Father    Allergies: Allergies  Allergen Reactions  . Bee Venom Anaphylaxis   Medications: See med rec.  Review of Systems: No fevers, chills, night sweats, weight loss, chest pain, or shortness of breath.   Objective:    General: Well Developed, well nourished, and in no acute distress.  Neuro: Alert and oriented x3, extra-ocular muscles intact, sensation grossly intact.  HEENT: Normocephalic, atraumatic, pupils equal round reactive to light, neck supple, no masses, no lymphadenopathy, thyroid nonpalpable.  Skin: Warm and dry, no rashes. Cardiac: Regular rate and rhythm, no murmurs rubs or gallops, no lower extremity edema.  Respiratory: Clear to auscultation bilaterally. Not using accessory muscles, speaking in full sentences.  Impression and Recommendations:    Obesity We stopped phentermine due to erectile dysfunction. We continue Ozempic full dose, he is lost about 10 pounds. Unfortunately weight loss has plateaued, he will continue to do Ozempic full dose, he will also continue to cut out carbs but I think at this point we are going to refer him to  bariatric surgery.   ___________________________________________ Gwen Her. Dianah Field, M.D., ABFM., CAQSM. Primary Care and Sports Medicine [place] [facility]  Adjunct Professor of [employer]

## 2019-08-23 ENCOUNTER — Other Ambulatory Visit: Payer: Self-pay | Admitting: Sports Medicine

## 2019-08-23 MED ORDER — INDOMETHACIN 50 MG PO CAPS: 50.0000 mg | ORAL_CAPSULE | Freq: Two times a day (BID) | ORAL | 1 refills | Status: DC

## 2019-09-15 ENCOUNTER — Other Ambulatory Visit: Payer: Self-pay | Admitting: Sports Medicine

## 2019-09-15 DIAGNOSIS — N529 Male erectile dysfunction, unspecified: Secondary | ICD-10-CM

## 2020-01-10 ENCOUNTER — Other Ambulatory Visit: Payer: Self-pay | Admitting: Sports Medicine

## 2020-01-10 DIAGNOSIS — N529 Male erectile dysfunction, unspecified: Secondary | ICD-10-CM

## 2020-02-11 ENCOUNTER — Other Ambulatory Visit: Payer: Self-pay

## 2020-02-11 ENCOUNTER — Ambulatory Visit (INDEPENDENT_AMBULATORY_CARE_PROVIDER_SITE_OTHER): Payer: BLUE CROSS/BLUE SHIELD | Admitting: Sports Medicine

## 2020-02-11 DIAGNOSIS — N529 Male erectile dysfunction, unspecified: Secondary | ICD-10-CM

## 2020-02-11 DIAGNOSIS — M170 Bilateral primary osteoarthritis of knee: Secondary | ICD-10-CM

## 2020-02-11 MED ORDER — SILDENAFIL CITRATE 50 MG PO TABS: 50.0000 mg | ORAL_TABLET | ORAL | 3 refills | Status: DC | PRN

## 2020-02-11 NOTE — Progress Notes (Addendum)
    Procedures performed today:    Procedure: Real-time Ultrasound Guided injection of the right knee Device: Samsung HS60  Verbal informed consent obtained.  Time-out conducted.  Noted no overlying erythema, induration, or other signs of local infection.  Skin prepped in a sterile fashion.  Local anesthesia: Topical Ethyl chloride.  With sterile technique and under real time ultrasound guidance: 1 cc Kenalog 40, 2 cc lidocaine, 2 cc bupivacaine injected easily Completed without difficulty  Pain immediately resolved suggesting accurate placement of the medication.  Advised to call if fevers/chills, erythema, induration, drainage, or persistent bleeding.  Images permanently stored and available for review in the ultrasound unit.  Impression: Technically successful ultrasound guided injection.  Independent interpretation of notes and tests performed by another provider:   None.  Brief History, Exam, Impression, and Recommendations:    Primary osteoarthritis of both knees Jonathan Villarreal returns, he is having recurrence of knee pain, last injection was in October 2020, we repeated the right knee injection today. I also filled out some disability paperwork. He is not interested in knee arthroplasty.    ___________________________________________ Jonathan Villarreal. Jonathan Villarreal, M.D., ABFM., CAQSM. Primary Care and Sports Medicine [place] [facility]  Adjunct Instructor of Family Medicine  [employer]

## 2020-02-11 NOTE — Assessment & Plan Note (Signed)
Sheri returns, he is having recurrence of knee pain, last injection was in October 2020, we repeated the right knee injection today. I also filled out some disability paperwork. He is not interested in knee arthroplasty.

## 2020-02-11 NOTE — Addendum Note (Signed)
Addended by: Monica Becton on: 02/11/2020 03:05 PM   Modules accepted: Orders

## 2020-02-25 ENCOUNTER — Ambulatory Visit (INDEPENDENT_AMBULATORY_CARE_PROVIDER_SITE_OTHER): Payer: 59 | Admitting: Sports Medicine

## 2020-02-25 ENCOUNTER — Encounter: Payer: Self-pay | Admitting: Sports Medicine

## 2020-02-25 DIAGNOSIS — R252 Cramp and spasm: Secondary | ICD-10-CM | POA: Diagnosis not present

## 2020-02-25 DIAGNOSIS — I10 Essential (primary) hypertension: Secondary | ICD-10-CM | POA: Diagnosis not present

## 2020-02-25 LAB — CBC
HCT: 42.3 % (ref 38.5–50.0)
Hemoglobin: 14.3 g/dL (ref 13.2–17.1)
MCH: 31.8 pg (ref 27.0–33.0)
MCHC: 33.8 g/dL (ref 32.0–36.0)
MCV: 94.2 fL (ref 80.0–100.0)
MPV: 10.1 fL (ref 7.5–12.5)
Platelets: 434 10*3/uL — ABNORMAL HIGH (ref 140–400)
RBC: 4.49 10*6/uL (ref 4.20–5.80)
RDW: 12 % (ref 11.0–15.0)
WBC: 9.5 10*3/uL (ref 3.8–10.8)

## 2020-02-25 LAB — COMPLETE METABOLIC PANEL WITH GFR
AG Ratio: 1.5 (calc) (ref 1.0–2.5)
ALT: 45 U/L (ref 9–46)
AST: 25 U/L (ref 10–40)
Albumin: 4.1 g/dL (ref 3.6–5.1)
Alkaline phosphatase (APISO): 71 U/L (ref 36–130)
BUN: 18 mg/dL (ref 7–25)
CO2: 28 mmol/L (ref 20–32)
Calcium: 9.4 mg/dL (ref 8.6–10.3)
Chloride: 100 mmol/L (ref 98–110)
Creat: 0.91 mg/dL (ref 0.60–1.35)
GFR, Est African American: 117 mL/min/{1.73_m2} (ref >=60)
GFR, Est Non African American: 101 mL/min/{1.73_m2} (ref >=60)
Globulin: 2.7 g/dL (calc) (ref 1.9–3.7)
Glucose, Bld: 106 mg/dL — ABNORMAL HIGH (ref 65–99)
Potassium: 4.8 mmol/L (ref 3.5–5.3)
Sodium: 135 mmol/L (ref 135–146)
Total Bilirubin: 0.4 mg/dL (ref 0.2–1.2)
Total Protein: 6.8 g/dL (ref 6.1–8.1)

## 2020-02-25 LAB — MAGNESIUM: Magnesium: 1.9 mg/dL (ref 1.5–2.5)

## 2020-02-25 LAB — CK: Total CK: 108 U/L (ref 44–196)

## 2020-02-25 LAB — PHOSPHORUS: Phosphorus: 1.9 mg/dL — ABNORMAL LOW (ref 2.5–4.5)

## 2020-02-25 MED ORDER — LISINOPRIL-HYDROCHLOROTHIAZIDE 20-25 MG PO TABS: 1.0000 | ORAL_TABLET | Freq: Every day | ORAL | 3 refills | Status: DC

## 2020-02-25 NOTE — Assessment & Plan Note (Signed)
Under control, refilling medication, he does occasionally miss doses.

## 2020-02-25 NOTE — Progress Notes (Signed)
    Procedures performed today:    None.  Independent interpretation of notes and tests performed by another provider:   None.  Brief History, Exam, Impression, and Recommendations:    Muscle cramp Jonathan Villarreal is a pleasant 49 year old male, he has been working outside, sweated a lot, and potentially got dehydrated, over the past week he has had some cramping in his calves, feet, hands, abdomen. We are going to check labs including CBC, CMP, magnesium, phosphate levels, CK levels.   Essential hypertension, benign Under control, refilling medication, he does occasionally miss doses.    ___________________________________________ Jonathan Villarreal. Benjamin Stain, M.D., ABFM., CAQSM. Primary Care and Sports Medicine [place] [facility]  Adjunct Instructor of Family Medicine  [employer]

## 2020-02-25 NOTE — Assessment & Plan Note (Signed)
Jonathan Villarreal is a pleasant 49 year old male, he has been working outside, sweated a lot, and potentially got dehydrated, over the past week he has had some cramping in his calves, feet, hands, abdomen. We are going to check labs including CBC, CMP, magnesium, phosphate levels, CK levels.

## 2020-03-10 ENCOUNTER — Ambulatory Visit (INDEPENDENT_AMBULATORY_CARE_PROVIDER_SITE_OTHER): Payer: 59 | Admitting: Sports Medicine

## 2020-03-10 ENCOUNTER — Other Ambulatory Visit: Payer: Self-pay

## 2020-03-10 DIAGNOSIS — M170 Bilateral primary osteoarthritis of knee: Secondary | ICD-10-CM

## 2020-03-10 MED ORDER — INDOMETHACIN 50 MG PO CAPS: 50.0000 mg | ORAL_CAPSULE | Freq: Two times a day (BID) | ORAL | 3 refills | Status: DC

## 2020-03-10 NOTE — Progress Notes (Signed)
    Procedures performed today:    None.  Independent interpretation of notes and tests performed by another provider:   None.  Brief History, Exam, Impression, and Recommendations:    Primary osteoarthritis of both knees Jonathan Villarreal returns, we injected his right knee about a month ago, he is pain-free. He needs a refill on his indomethacin.    ___________________________________________ Ihor Austin. Benjamin Stain, M.D., ABFM., CAQSM. Primary Care and Sports Medicine [place] [facility]  Adjunct Instructor of Family Medicine  [employer]

## 2020-03-10 NOTE — Assessment & Plan Note (Signed)
Jonathan Villarreal returns, we injected his right knee about a month ago, he is pain-free. He needs a refill on his indomethacin.

## 2020-03-19 ENCOUNTER — Ambulatory Visit (INDEPENDENT_AMBULATORY_CARE_PROVIDER_SITE_OTHER): Payer: 59 | Admitting: Sports Medicine

## 2020-03-19 ENCOUNTER — Encounter: Payer: Self-pay | Admitting: Sports Medicine

## 2020-03-19 VITALS — BP 117/76 | HR 91 | Ht 66.0 in | Wt 327.0 lb

## 2020-03-19 DIAGNOSIS — Z0000 Encounter for general adult medical examination without abnormal findings: Secondary | ICD-10-CM

## 2020-03-19 DIAGNOSIS — E782 Mixed hyperlipidemia: Secondary | ICD-10-CM | POA: Diagnosis not present

## 2020-03-19 DIAGNOSIS — E559 Vitamin D deficiency, unspecified: Secondary | ICD-10-CM

## 2020-03-19 LAB — CBC
HCT: 38.9 % (ref 38.5–50.0)
Hemoglobin: 13 g/dL — ABNORMAL LOW (ref 13.2–17.1)
MCH: 30.9 pg (ref 27.0–33.0)
MCHC: 33.4 g/dL (ref 32.0–36.0)
MCV: 92.4 fL (ref 80.0–100.0)
MPV: 9.9 fL (ref 7.5–12.5)
Platelets: 430 10*3/uL — ABNORMAL HIGH (ref 140–400)
RBC: 4.21 10*6/uL (ref 4.20–5.80)
RDW: 12.6 % (ref 11.0–15.0)
WBC: 9.2 10*3/uL (ref 3.8–10.8)

## 2020-03-19 LAB — LIPID PANEL W/REFLEX DIRECT LDL
Cholesterol: 208 mg/dL — ABNORMAL HIGH (ref <200)
HDL: 55 mg/dL (ref >=40)
LDL Cholesterol (Calc): 124 mg/dL (calc) — ABNORMAL HIGH
Non-HDL Cholesterol (Calc): 153 mg/dL (calc) — ABNORMAL HIGH (ref <130)
Total CHOL/HDL Ratio: 3.8 (calc) (ref <5.0)
Triglycerides: 171 mg/dL — ABNORMAL HIGH (ref <150)

## 2020-03-19 LAB — COMPLETE METABOLIC PANEL WITH GFR
AG Ratio: 1.4 (calc) (ref 1.0–2.5)
ALT: 40 U/L (ref 9–46)
AST: 27 U/L (ref 10–40)
Albumin: 4.1 g/dL (ref 3.6–5.1)
Alkaline phosphatase (APISO): 65 U/L (ref 36–130)
BUN: 14 mg/dL (ref 7–25)
CO2: 29 mmol/L (ref 20–32)
Calcium: 9.5 mg/dL (ref 8.6–10.3)
Chloride: 99 mmol/L (ref 98–110)
Creat: 0.85 mg/dL (ref 0.60–1.35)
GFR, Est African American: 120 mL/min/{1.73_m2} (ref >=60)
GFR, Est Non African American: 104 mL/min/{1.73_m2} (ref >=60)
Globulin: 2.9 g/dL (calc) (ref 1.9–3.7)
Glucose, Bld: 111 mg/dL — ABNORMAL HIGH (ref 65–99)
Potassium: 4.4 mmol/L (ref 3.5–5.3)
Sodium: 137 mmol/L (ref 135–146)
Total Bilirubin: 0.4 mg/dL (ref 0.2–1.2)
Total Protein: 7 g/dL (ref 6.1–8.1)

## 2020-03-19 LAB — TSH: TSH: 1.61 mIU/L (ref 0.40–4.50)

## 2020-03-19 LAB — VITAMIN D 25 HYDROXY (VIT D DEFICIENCY, FRACTURES): Vit D, 25-Hydroxy: 26 ng/mL — ABNORMAL LOW (ref 30–100)

## 2020-03-19 NOTE — Assessment & Plan Note (Signed)
Jonathan Villarreal is here for his annual physical, we filled out his form today, ordering routine labs, return in 1 year for this.

## 2020-03-19 NOTE — Progress Notes (Addendum)
Subjective:    CC: Annual Physical Exam  HPI:  This patient is here for their annual physical  I reviewed the past medical history, family history, social history, surgical history, and allergies today and no changes were needed.  Please see the problem list section below in epic for further details.  Past Medical History: Past Medical History:  Diagnosis Date  . Asthma    Past Surgical History: Past Surgical History:  Procedure Laterality Date  . APPENDECTOMY     Social History: Social History   Socioeconomic History  . Marital status: Married    Spouse name: Not on file  . Number of children: Not on file  . Years of education: Not on file  . Highest education level: Not on file  Occupational History  . Not on file  Tobacco Use  . Smoking status: Former Smoker    Packs/day: 0.50    Types: Cigarettes  . Smokeless tobacco: Never Used  Substance and Sexual Activity  . Alcohol use: Yes    Alcohol/week: 0.0 standard drinks  . Drug use: No  . Sexual activity: Not on file  Other Topics Concern  . Not on file  Social History Narrative  . Not on file   Social Determinants of Health   Financial Resource Strain:   . Difficulty of Paying Living Expenses:   Food Insecurity:   . Worried About Programme researcher, broadcasting/film/video in the Last Year:   . Barista in the Last Year:   Transportation Needs:   . Freight forwarder (Medical):   Marland Kitchen Lack of Transportation (Non-Medical):   Physical Activity:   . Days of Exercise per Week:   . Minutes of Exercise per Session:   Stress:   . Feeling of Stress :   Social Connections:   . Frequency of Communication with Friends and Family:   . Frequency of Social Gatherings with Friends and Family:   . Attends Religious Services:   . Active Member of Clubs or Organizations:   . Attends Banker Meetings:   Marland Kitchen Marital Status:    Family History: Family History  Problem Relation Age of Onset  . Diabetes Mother   .  Hypertension Father    Allergies: Allergies  Allergen Reactions  . Bee Venom Anaphylaxis   Medications: See med rec.  Review of Systems: No headache, visual changes, nausea, vomiting, diarrhea, constipation, dizziness, abdominal pain, skin rash, fevers, chills, night sweats, swollen lymph nodes, weight loss, chest pain, body aches, joint swelling, muscle aches, shortness of breath, mood changes, visual or auditory hallucinations.  Objective:    General: Well Developed, well nourished, and in no acute distress.  Neuro: Alert and oriented x3, extra-ocular muscles intact, sensation grossly intact. Cranial nerves II through XII are intact, motor, sensory, and coordinative functions are all intact. HEENT: Normocephalic, atraumatic, pupils equal round reactive to light, neck supple, no masses, no lymphadenopathy, thyroid nonpalpable. Oropharynx, nasopharynx, external ear canals are unremarkable. Skin: Warm and dry, no rashes noted.  Cardiac: Regular rate and rhythm, no murmurs rubs or gallops.  Respiratory: Clear to auscultation bilaterally. Not using accessory muscles, speaking in full sentences.  Abdominal: Soft, nontender, nondistended, positive bowel sounds, no masses, no organomegaly.  Musculoskeletal: Shoulder, elbow, wrist, hip, knee, ankle stable, and with full range of motion.  Impression and Recommendations:    The patient was counselled, risk factors were discussed, anticipatory guidance given.  Annual physical exam Jonathan Villarreal is here for his annual physical, we filled  out his form today, ordering routine labs, return in 1 year for this.  Mixed hyperlipidemia Lipids are elevated, adding low-dose atorvastatin, 10 mg, recheck lipids in 3 months.   ___________________________________________ Ihor Austin. Benjamin Stain, M.D., ABFM., CAQSM. Primary Care and Sports Medicine [place] [facility]  Adjunct Professor of Family Medicine  [employer]  Medicine

## 2020-03-20 DIAGNOSIS — E782 Mixed hyperlipidemia: Secondary | ICD-10-CM | POA: Insufficient documentation

## 2020-03-20 MED ORDER — VITAMIN D (ERGOCALCIFEROL) 1.25 MG (50000 UNIT) PO CAPS
50000.0000 [IU] | ORAL_CAPSULE | ORAL | 0 refills | Status: DC
Start: 2020-03-20 — End: 2020-06-19

## 2020-03-20 MED ORDER — ATORVASTATIN CALCIUM 10 MG PO TABS: 10.0000 mg | ORAL_TABLET | Freq: Every day | ORAL | 3 refills | Status: DC

## 2020-03-20 NOTE — Assessment & Plan Note (Signed)
Lipids are elevated, adding low-dose atorvastatin, 10 mg, recheck lipids in 3 months.

## 2020-03-20 NOTE — Addendum Note (Signed)
Addended by: Monica Becton on: 03/20/2020 08:57 AM   Modules accepted: Orders

## 2020-05-04 ENCOUNTER — Telehealth: Payer: Self-pay | Admitting: Sports Medicine

## 2020-05-04 NOTE — Telephone Encounter (Signed)
The [place] disability paperwork filled out today, this will be sent for faxing, scanning, and for medical records to be attached

## 2020-05-07 ENCOUNTER — Ambulatory Visit: Payer: 59 | Admitting: Sports Medicine

## 2020-06-19 ENCOUNTER — Ambulatory Visit (INDEPENDENT_AMBULATORY_CARE_PROVIDER_SITE_OTHER): Payer: 59

## 2020-06-19 ENCOUNTER — Ambulatory Visit (INDEPENDENT_AMBULATORY_CARE_PROVIDER_SITE_OTHER): Payer: 59 | Admitting: Sports Medicine

## 2020-06-19 ENCOUNTER — Encounter: Payer: Self-pay | Admitting: Sports Medicine

## 2020-06-19 VITALS — Wt 329.0 lb

## 2020-06-19 DIAGNOSIS — M170 Bilateral primary osteoarthritis of knee: Secondary | ICD-10-CM

## 2020-06-19 DIAGNOSIS — E6609 Other obesity due to excess calories: Secondary | ICD-10-CM

## 2020-06-19 DIAGNOSIS — Z23 Encounter for immunization: Secondary | ICD-10-CM | POA: Diagnosis not present

## 2020-06-19 MED ORDER — WEGOVY 1 MG/0.5ML ~~LOC~~ SOAJ: 1.0000 mg | SUBCUTANEOUS | 0 refills | Status: DC

## 2020-06-19 MED ORDER — WEGOVY 0.5 MG/0.5ML ~~LOC~~ SOAJ: 0.5000 mg | SUBCUTANEOUS | 0 refills | Status: DC

## 2020-06-19 MED ORDER — WEGOVY 1.7 MG/0.75ML ~~LOC~~ SOAJ: 1.7000 mg | SUBCUTANEOUS | 0 refills | Status: DC

## 2020-06-19 MED ORDER — WEGOVY 0.25 MG/0.5ML ~~LOC~~ SOAJ: 0.2500 mg | SUBCUTANEOUS | 0 refills | Status: DC

## 2020-06-19 NOTE — Assessment & Plan Note (Signed)
Flemon returns, we injected his left knee last in May, repeated today.

## 2020-06-19 NOTE — Progress Notes (Signed)
    Procedures performed today:    Procedure: Real-time Ultrasound Guided injection of the left knee Device: Samsung HS60  Verbal informed consent obtained.  Time-out conducted.  Noted no overlying erythema, induration, or other signs of local infection.  Skin prepped in a sterile fashion.  Local anesthesia: Topical Ethyl chloride.  With sterile technique and under real time ultrasound guidance: 1 cc Kenalog 40, 2 cc lidocaine, 2 cc bupivacaine injected easily Completed without difficulty  Pain immediately resolved suggesting accurate placement of the medication.  Advised to call if fevers/chills, erythema, induration, drainage, or persistent bleeding.  Images permanently stored and available for review in PACS.  Impression: Technically successful ultrasound guided injection.  Independent interpretation of notes and tests performed by another provider:   None.  Brief History, Exam, Impression, and Recommendations:    Primary osteoarthritis of both knees Jonathan Villarreal returns, we injected his left knee last in May, repeated today.  Obesity Jonathan Villarreal would also like some help with his weight, starting Wegovy.    ___________________________________________ Ihor Austin. Benjamin Stain, M.D., ABFM., CAQSM. Primary Care and Sports Medicine [place] [facility]  Adjunct Instructor of Family Medicine  [employer] [facility] [employer] [facility] [employer]

## 2020-06-19 NOTE — Assessment & Plan Note (Signed)
Gaberiel would also like some help with his weight, starting QQVZDG.

## 2020-09-01 ENCOUNTER — Encounter: Payer: Self-pay | Admitting: Sports Medicine

## 2020-09-01 ENCOUNTER — Other Ambulatory Visit: Payer: Self-pay

## 2020-09-01 ENCOUNTER — Ambulatory Visit (INDEPENDENT_AMBULATORY_CARE_PROVIDER_SITE_OTHER): Payer: 59 | Admitting: Sports Medicine

## 2020-09-01 DIAGNOSIS — I10 Essential (primary) hypertension: Secondary | ICD-10-CM

## 2020-09-01 DIAGNOSIS — E6609 Other obesity due to excess calories: Secondary | ICD-10-CM

## 2020-09-01 MED ORDER — AMLODIPINE BESYLATE 5 MG PO TABS: 5.0000 mg | ORAL_TABLET | Freq: Every day | ORAL | 3 refills | Status: DC

## 2020-09-01 MED ORDER — WEGOVY 2.4 MG/0.75ML ~~LOC~~ SOAJ: 2.4000 mg | SUBCUTANEOUS | 11 refills | Status: DC

## 2020-09-01 NOTE — Progress Notes (Addendum)
    Procedures performed today:    None.  Independent interpretation of notes and tests performed by another provider:   None.  Brief History, Exam, Impression, and Recommendations:    Obesity Jonathan Villarreal returns, he is currently on 1 mg of Wegovy, he has only lost about 7 pounds. I am going to go ahead and bump him up to the maximum dose. Return in 3 months.  Essential hypertension, benign Blood pressure continues to be highly elevated, adding amlodipine 5, nurse visit recheck in 2 weeks.    ___________________________________________ Jonathan Villarreal. Jonathan Villarreal, M.D., ABFM., CAQSM. Primary Care and Sports Medicine [place] [facility]  Adjunct Instructor of Family Medicine  [employer]

## 2020-09-01 NOTE — Assessment & Plan Note (Signed)
Jonathan Villarreal returns, he is currently on 1 mg of Wegovy, he has only lost about 7 pounds. I am going to go ahead and bump him up to the maximum dose. Return in 3 months.

## 2020-09-01 NOTE — Addendum Note (Signed)
Addended by: Monica Becton on: 09/01/2020 11:30 AM   Modules accepted: Orders

## 2020-09-01 NOTE — Assessment & Plan Note (Signed)
Blood pressure continues to be highly elevated, adding amlodipine 5, nurse visit recheck in 2 weeks.

## 2020-09-15 ENCOUNTER — Ambulatory Visit: Payer: 59 | Admitting: Sports Medicine

## 2020-09-22 ENCOUNTER — Ambulatory Visit: Payer: 59 | Admitting: Sports Medicine

## 2020-11-30 ENCOUNTER — Ambulatory Visit: Payer: 59 | Admitting: Sports Medicine

## 2020-12-03 ENCOUNTER — Encounter: Payer: Self-pay | Admitting: Sports Medicine

## 2020-12-03 ENCOUNTER — Ambulatory Visit (INDEPENDENT_AMBULATORY_CARE_PROVIDER_SITE_OTHER): Payer: Self-pay | Admitting: Sports Medicine

## 2020-12-03 ENCOUNTER — Other Ambulatory Visit: Payer: Self-pay

## 2020-12-03 DIAGNOSIS — E6609 Other obesity due to excess calories: Secondary | ICD-10-CM

## 2020-12-03 DIAGNOSIS — I10 Essential (primary) hypertension: Secondary | ICD-10-CM

## 2020-12-03 DIAGNOSIS — F32A Depression, unspecified: Secondary | ICD-10-CM

## 2020-12-03 DIAGNOSIS — F419 Anxiety disorder, unspecified: Secondary | ICD-10-CM

## 2020-12-03 MED ORDER — LISINOPRIL-HYDROCHLOROTHIAZIDE 20-25 MG PO TABS: 1.0000 | ORAL_TABLET | Freq: Every day | ORAL | 3 refills | Status: DC

## 2020-12-03 MED ORDER — INDOMETHACIN 50 MG PO CAPS
50.0000 mg | ORAL_CAPSULE | Freq: Two times a day (BID) | ORAL | 3 refills | Status: DC
Start: 2020-12-03 — End: 2020-12-03

## 2020-12-03 MED ORDER — PHENTERMINE HCL 37.5 MG PO TABS: ORAL_TABLET | ORAL | 0 refills | Status: DC

## 2020-12-03 MED ORDER — INDOMETHACIN 50 MG PO CAPS
50.0000 mg | ORAL_CAPSULE | Freq: Two times a day (BID) | ORAL | 3 refills | Status: DC
Start: 2020-12-03 — End: 2021-08-11

## 2020-12-03 MED ORDER — SERTRALINE HCL 25 MG PO TABS: 25.0000 mg | ORAL_TABLET | Freq: Every day | ORAL | 2 refills | Status: DC

## 2020-12-03 NOTE — Progress Notes (Signed)
    Procedures performed today:    None.  Independent interpretation of notes and tests performed by another provider:   None.  Brief History, Exam, Impression, and Recommendations:    Obesity Jonathan Villarreal did really well with Wegovy, unfortunately the discount coupon ended, we will switch back to phentermine, one half tab daily. At a follow-up visit if he does not have good maintenance or loss of weight we can add Topamax as well. He is currently uninsured, so when he gets insurance we may consider the addition of Saxenda.  Anxiety and depression Multiple stressors, his father moved in, suffering from Alzheimer's. Jonathan Villarreal was tearful in the exam room, he is having significant irritability, insomnia. Adding Zoloft 25, we will do a steady up taper over the next few months. Return in 6 weeks for PHQ/GAD.  Zoloft 25    ___________________________________________ Jonathan Villarreal. Benjamin Stain, M.D., ABFM., CAQSM. Primary Care and Sports Medicine [place] [facility]  Adjunct Instructor of Family Medicine  [employer] [facility] [employer] [facility] [employer]

## 2020-12-03 NOTE — Assessment & Plan Note (Signed)
Jonathan Villarreal did really well with Wegovy, unfortunately the discount coupon ended, we will switch back to phentermine, one half tab daily. At a follow-up visit if he does not have good maintenance or loss of weight we can add Topamax as well. He is currently uninsured, so when he gets insurance we may consider the addition of Saxenda.

## 2020-12-03 NOTE — Assessment & Plan Note (Signed)
Multiple stressors, his father moved in, suffering from Alzheimer's. Jonathan Villarreal was tearful in the exam room, he is having significant irritability, insomnia. Adding Zoloft 25, we will do a steady up taper over the next few months. Return in 6 weeks for PHQ/GAD.  Zoloft 25

## 2020-12-08 MED ORDER — TOPIRAMATE 50 MG PO TABS: ORAL_TABLET | ORAL | 11 refills | Status: DC

## 2020-12-10 NOTE — Telephone Encounter (Signed)
Lets at least do a virtual visit so I can address his sleep hygiene.

## 2021-01-14 ENCOUNTER — Ambulatory Visit: Payer: Self-pay | Admitting: Sports Medicine

## 2021-02-02 ENCOUNTER — Ambulatory Visit (INDEPENDENT_AMBULATORY_CARE_PROVIDER_SITE_OTHER): Payer: Self-pay | Admitting: Sports Medicine

## 2021-02-02 ENCOUNTER — Encounter: Payer: Self-pay | Admitting: Sports Medicine

## 2021-02-02 ENCOUNTER — Other Ambulatory Visit: Payer: Self-pay

## 2021-02-02 VITALS — BP 119/76 | HR 72 | Ht 66.0 in | Wt 292.0 lb

## 2021-02-02 DIAGNOSIS — Z0000 Encounter for general adult medical examination without abnormal findings: Secondary | ICD-10-CM

## 2021-02-02 DIAGNOSIS — E6609 Other obesity due to excess calories: Secondary | ICD-10-CM

## 2021-02-02 DIAGNOSIS — M5416 Radiculopathy, lumbar region: Secondary | ICD-10-CM

## 2021-02-02 DIAGNOSIS — F32A Depression, unspecified: Secondary | ICD-10-CM

## 2021-02-02 DIAGNOSIS — F419 Anxiety disorder, unspecified: Secondary | ICD-10-CM

## 2021-02-02 MED ORDER — SERTRALINE HCL 50 MG PO TABS: 50.0000 mg | ORAL_TABLET | Freq: Every day | ORAL | 11 refills | Status: DC

## 2021-02-02 NOTE — Assessment & Plan Note (Signed)
We switched back to phentermine at the last visit, he has been doing a half tab daily, we also added Topamax per his request. He has had a 14 pound weight loss over the past 6 weeks, continue medications. Will follow this up again in 6 weeks at which point we can do a refill.

## 2021-02-02 NOTE — Assessment & Plan Note (Signed)
Working with Dr. Rubye Beach, currently on tramadol with a pain contract with [facility]. He was describing some lower extremity paresthesias when going from sitting to standing, we will hold off on aggressive management here until his mood is under control. If persistent symptoms we will consider nerve conduction/EMG.

## 2021-02-02 NOTE — Assessment & Plan Note (Signed)
Multiple stressors, father's moved in, his father is suffering from Alzheimer's, at the last visit he was very tearful in the exam room, appears a bit more stoic today, we started Zoloft 25, he has had slight improvements, PHQ/gad scores have improved significantly however, increasing to 50 mg. He is complaining of significant fatigue, difficulty sleeping, and lower extremity paresthesias which I think are certainly related to his depression, before we do the million dollar work-up for his paresthesias we will get his mood under control first. (Of note he tells me he had these paresthesias before starting Topamax which can certainly cause numbness and tingling.)

## 2021-02-02 NOTE — Progress Notes (Signed)
    Procedures performed today:    None.  Independent interpretation of notes and tests performed by another provider:   None.  Brief History, Exam, Impression, and Recommendations:    Anxiety and depression Multiple stressors, father's moved in, his father is suffering from Alzheimer's, at the last visit he was very tearful in the exam room, appears a bit more stoic today, we started Zoloft 25, he has had slight improvements, PHQ/gad scores have improved significantly however, increasing to 50 mg. He is complaining of significant fatigue, difficulty sleeping, and lower extremity paresthesias which I think are certainly related to his depression, before we do the million dollar work-up for his paresthesias we will get his mood under control first. (Of note he tells me he had these paresthesias before starting Topamax which can certainly cause numbness and tingling.)  Bilateral lumbar radiculopathy Working with Dr. Rubye Beach, currently on tramadol with a pain contract with [facility]. He was describing some lower extremity paresthesias when going from sitting to standing, we will hold off on aggressive management here until his mood is under control. If persistent symptoms we will consider nerve conduction/EMG.  Obesity We switched back to phentermine at the last visit, he has been doing a half tab daily, we also added Topamax per his request. He has had a 14 pound weight loss over the past 6 weeks, continue medications. Will follow this up again in 6 weeks at which point we can do a refill.  Annual physical exam I did print out additional disability paperwork, change the dates and give it back to [place].    ___________________________________________ Ihor Austin. Benjamin Stain, M.D., ABFM., CAQSM. Primary Care and Sports Medicine [place] [facility]  Adjunct Instructor of Family Medicine  [employer] [facility] [employer] [employer]

## 2021-02-02 NOTE — Assessment & Plan Note (Signed)
I did print out additional disability paperwork, change the dates and give it back to [place].

## 2021-02-03 ENCOUNTER — Other Ambulatory Visit: Payer: Self-pay | Admitting: Sports Medicine

## 2021-02-03 DIAGNOSIS — I10 Essential (primary) hypertension: Secondary | ICD-10-CM

## 2021-02-24 ENCOUNTER — Other Ambulatory Visit: Payer: Self-pay | Admitting: Sports Medicine

## 2021-02-24 DIAGNOSIS — F32A Depression, unspecified: Secondary | ICD-10-CM

## 2021-02-24 DIAGNOSIS — F419 Anxiety disorder, unspecified: Secondary | ICD-10-CM

## 2021-02-25 ENCOUNTER — Other Ambulatory Visit: Payer: Self-pay | Admitting: Sports Medicine

## 2021-02-25 DIAGNOSIS — N529 Male erectile dysfunction, unspecified: Secondary | ICD-10-CM

## 2021-03-08 ENCOUNTER — Other Ambulatory Visit: Payer: Self-pay | Admitting: Sports Medicine

## 2021-03-08 DIAGNOSIS — E6609 Other obesity due to excess calories: Secondary | ICD-10-CM

## 2021-03-08 DIAGNOSIS — F32A Depression, unspecified: Secondary | ICD-10-CM

## 2021-03-16 ENCOUNTER — Other Ambulatory Visit: Payer: Self-pay

## 2021-03-16 ENCOUNTER — Encounter: Payer: Self-pay | Admitting: Sports Medicine

## 2021-03-16 ENCOUNTER — Ambulatory Visit: Payer: Self-pay | Admitting: Sports Medicine

## 2021-03-16 DIAGNOSIS — E6609 Other obesity due to excess calories: Secondary | ICD-10-CM

## 2021-03-16 DIAGNOSIS — F32A Depression, unspecified: Secondary | ICD-10-CM

## 2021-03-16 DIAGNOSIS — F419 Anxiety disorder, unspecified: Secondary | ICD-10-CM

## 2021-03-16 MED ORDER — TOPIRAMATE 100 MG PO TABS: 100.0000 mg | ORAL_TABLET | Freq: Two times a day (BID) | ORAL | 3 refills | Status: DC

## 2021-03-16 MED ORDER — SERTRALINE HCL 50 MG PO TABS: 75.0000 mg | ORAL_TABLET | Freq: Every day | ORAL | 4 refills | Status: DC

## 2021-03-16 MED ORDER — PHENTERMINE HCL 37.5 MG PO TABS: ORAL_TABLET | ORAL | 0 refills | Status: DC

## 2021-03-16 NOTE — Assessment & Plan Note (Signed)
Good continued weight loss, continue phentermine but he does need to be taking half tab, increasing Topamax. Return to see me in 1 month, goal weight 190 pounds.

## 2021-03-16 NOTE — Assessment & Plan Note (Signed)
Jonathan Villarreal has multiple stressors, his father suffering from Alzheimer's and moved into the house, we started with 25 followed by 50 mg of Zoloft that helped his mood significantly. We are going to go up to 75 mg of Zoloft.

## 2021-03-16 NOTE — Progress Notes (Signed)
    Procedures performed today:    None.  Independent interpretation of notes and tests performed by another provider:   None.  Brief History, Exam, Impression, and Recommendations:    Anxiety and depression Rosbel has multiple stressors, his father suffering from Alzheimer's and moved into the house, we started with 25 followed by 50 mg of Zoloft that helped his mood significantly. We are going to go up to 75 mg of Zoloft.   Obesity Good continued weight loss, continue phentermine but he does need to be taking half tab, increasing Topamax. Return to see me in 1 month, goal weight 190 pounds.    ___________________________________________ Ihor Austin. Benjamin Stain, M.D., ABFM., CAQSM. Primary Care and Sports Medicine [place] [facility]  Adjunct Instructor of Family Medicine  [employer]

## 2021-04-19 ENCOUNTER — Telehealth: Payer: Self-pay | Admitting: Sports Medicine

## 2021-04-19 ENCOUNTER — Emergency Department
Admission: EM | Admit: 2021-04-19 | Discharge: 2021-04-19 | Disposition: A | Discharge status: Home / Self Care | Payer: Self-pay | Source: Home / Self Care

## 2021-04-19 ENCOUNTER — Emergency Department (INDEPENDENT_AMBULATORY_CARE_PROVIDER_SITE_OTHER): Payer: Self-pay

## 2021-04-19 DIAGNOSIS — M25562 Pain in left knee: Secondary | ICD-10-CM

## 2021-04-19 DIAGNOSIS — M25561 Pain in right knee: Secondary | ICD-10-CM

## 2021-04-19 NOTE — ED Provider Notes (Signed)
[facility]    CSN: [id number] Arrival date & time: 04/19/21  1147      History   Chief Complaint Chief Complaint  Patient presents with   Fall   Knee Pain    Bilateral    HPI Jonathan Villarreal is a 50 y.o. male.   HPI 50 year old male presents with bilateral knee pain secondary to fall that occurred yesterday around 1:30 PM at his home.  Patient reports while climbing out of his pool on pool ladder he lost his balance and fell onto both knees in grassy area adjacent to pool.  Patient has history of osteoarthritis of both knees.  Patient reports he is followed by pain management on a regular basis.  Currently reports bilateral knee pain as 6 of 10.  Past Medical History:  Diagnosis Date   Asthma     Patient Active Problem List   Diagnosis Date Noted   Anxiety and depression 12/03/2020   Mixed hyperlipidemia 03/20/2020   Muscle cramp 02/25/2020   Pustule left fifth toe 05/15/2019   Chest pain 04/18/2019   Erectile dysfunction 04/18/2019   Metabolic syndrome 02/15/2019   Rotator cuff tear, right 06/08/2017   Annual physical exam 02/15/2017   Former smoker 10/06/2015   Bilateral lumbar radiculopathy 06/15/2015   Asthma, mild intermittent 04/17/2014   Essential hypertension, benign 02/13/2014   Primary osteoarthritis of both knees 11/21/2013   Obesity 11/21/2013    Past Surgical History:  Procedure Laterality Date   APPENDECTOMY         Home Medications    Prior to Admission medications   Medication Sig Start Date End Date Taking? Authorizing Provider  albuterol (PROVENTIL) (2.5 MG/3ML) 0.083% nebulizer solution Take 3 mLs (2.5 mg total) by nebulization every 4 (four) hours as needed for wheezing or shortness of breath (please include nebulizer machine, hoses, and mask if needed.). 12/24/18   Monica Becton, MD  Albuterol Sulfate (PROAIR RESPICLICK) 108 (90 Base) MCG/ACT AEPB Inhale 2 puffs into the lungs every 6 (six) hours as needed (cough, wheeze,  SOB). 02/15/18   Monica Becton, MD  AMBULATORY NON FORMULARY MEDICATION Incentive spirometer to be used q2-3h. 04/19/19   Monica Becton, MD  amLODipine (NORVASC) 5 MG tablet TAKE 1 TABLET (5 MG TOTAL) BY MOUTH DAILY. 02/03/21   Monica Becton, MD  atorvastatin (LIPITOR) 10 MG tablet Take 1 tablet (10 mg total) by mouth daily at 6 PM. 03/20/20   Monica Becton, MD  indomethacin (INDOCIN) 50 MG capsule Take 1 capsule (50 mg total) by mouth 2 (two) times daily with a meal. 12/03/20   Monica Becton, MD  lisinopril-hydrochlorothiazide (ZESTORETIC) 20-25 MG tablet Take 1 tablet by mouth daily. 12/03/20   Monica Becton, MD  phentermine (ADIPEX-P) 37.5 MG tablet One-half tab by mouth qAM 03/16/21   Monica Becton, MD  Respiratory Therapy Supplies (NEBULIZER AIR TUBE/PLUGS) MISC Use as needed, tubing, mask 02/27/19   Monica Becton, MD  sertraline (ZOLOFT) 50 MG tablet Take 1.5 tablets (75 mg total) by mouth daily. 03/16/21   Monica Becton, MD  sildenafil (VIAGRA) 50 MG tablet TAKE ONE TABLET BY MOUTH AS NEEDED FOR ERECTILE DYSFUNCTION 02/26/21   Monica Becton, MD  topiramate (TOPAMAX) 100 MG tablet Take 1 tablet (100 mg total) by mouth 2 (two) times daily. 03/16/21   Monica Becton, MD    Family History Family History  Problem Relation Age of Onset   Diabetes Mother  Hypertension Father     Social History Social History   Tobacco Use   Smoking status: Former    Packs/day: 0.50    Types: Cigarettes   Smokeless tobacco: Never  Substance Use Topics   Alcohol use: Yes    Alcohol/week: 0.0 standard drinks   Drug use: No     Allergies   Bee venom   Review of Systems Review of Systems  Musculoskeletal:        Bilateral knee pain x 2 days secondary to fall at home  All other systems reviewed and are negative.   Physical Exam Triage Vital Signs ED Triage Vitals  Enc Vitals Group     BP 04/19/21 1156 (!)  165/100     Pulse Rate 04/19/21 1156 83     Resp 04/19/21 1156 18     Temp 04/19/21 1156 98.4 F (36.9 C)     Temp Source 04/19/21 1156 Oral     SpO2 04/19/21 1156 98 %     Weight --      Height --      Head Circumference --      Peak Flow --      Pain Score 04/19/21 1158 6     Pain Loc --      Pain Edu? --      Excl. in GC? --    No data found.  Updated Vital Signs BP (!) 165/100 (BP Location: Right Arm)   Pulse 83   Temp 98.4 F (36.9 C) (Oral)   Resp 18   SpO2 98%      Physical Exam Vitals and nursing note reviewed.  Constitutional:      General: He is not in acute distress.    Appearance: Normal appearance. He is obese. He is not ill-appearing.  HENT:     Head: Normocephalic and atraumatic.     Mouth/Throat:     Mouth: Mucous membranes are moist.     Pharynx: Oropharynx is clear.  Eyes:     Extraocular Movements: Extraocular movements intact.     Conjunctiva/sclera: Conjunctivae normal.     Pupils: Pupils are equal, round, and reactive to light.  Cardiovascular:     Rate and Rhythm: Normal rate and regular rhythm.     Pulses: Normal pulses.     Heart sounds: Normal heart sounds.  Pulmonary:     Effort: Pulmonary effort is normal.     Breath sounds: Normal breath sounds. No wheezing, rhonchi or rales.  Musculoskeletal:        General: Swelling, tenderness and signs of injury present. No deformity. Normal range of motion.     Cervical back: Normal range of motion and neck supple. No tenderness.     Comments: Bilateral knees (anterior aspect): TTP over medial inferior aspect of patellas bilaterally with mild soft tissue swelling noted, minimal pain elicited with flexion/extension, negative Lachman's, negative anterior drawer  Lymphadenopathy:     Cervical: No cervical adenopathy.  Skin:    General: Skin is warm and dry.  Neurological:     General: No focal deficit present.     Mental Status: He is alert and oriented to person, place, and time. Mental status  is at baseline.  Psychiatric:        Mood and Affect: Mood normal.        Behavior: Behavior normal.        Thought Content: Thought content normal.     UC Treatments / Results  Labs (all labs  ordered are listed, but only abnormal results are displayed) Labs Reviewed - No data to display  EKG   Radiology DG Knee Complete 4 Views Left  Result Date: 04/19/2021 CLINICAL DATA:  Fall yesterday, pain in both knees EXAM: LEFT KNEE - COMPLETE 4+ VIEW COMPARISON:  Left knee radiographs 11/20/2013 FINDINGS: There is no evidence of acute fracture or dislocation. Alignment is normal. There is medial worse than lateral tibiofemoral joint space narrowing with associated subchondral sclerosis and osteophytosis, consistent with osteoarthritis, progressed since 2015. There is no effusion. The soft tissues are unremarkable. IMPRESSION: No acute fracture or dislocation. Degenerative changes as above, progressed since 2015 Electronically Signed   By: Lesia Hausen MD   On: 04/19/2021 13:17   DG Knee Complete 4 Views Right  Result Date: 04/19/2021 CLINICAL DATA:  Fall yesterday, pain to both knees EXAM: RIGHT KNEE - COMPLETE 4+ VIEW COMPARISON:  Left knee radiographs 11/20/2013 FINDINGS: There is no acute fracture or dislocation. Alignment is normal. There is moderate medial and mild lateral tibiofemoral joint space narrowing with associated subchondral sclerosis and osteophytosis, consistent with osteoarthritis. There is no knee effusion. The soft tissues are unremarkable. IMPRESSION: No acute fracture or dislocation.  Degenerative changes as above. Electronically Signed   By: Lesia Hausen MD   On: 04/19/2021 13:13    Procedures Procedures (including critical care time)  Medications Ordered in UC Medications - No data to display  Initial Impression / Assessment and Plan / UC Course  I have reviewed the triage vital signs and the nursing notes.  Pertinent labs & imaging results that were available during  my care of the patient were reviewed by me and considered in my medical decision making (see chart for details).     MDM: 1.  Acute pain of left knee; 2.  Acute pain of right knee-bilateral knee x-rays were negative. Advised patient x-rays were negative for acute fracture and/or dislocation.  Advised patient he can RICE bilateral knees for 20 minutes 2-3 times daily for the next 3 to 4 days.  Discharged home, hemodynamically stable.  Patient is currently followed by [facility] Orthopedic provider and will follow-up with this provider once returned from vacation. Final Clinical Impressions(s) / UC Diagnoses   Final diagnoses:  Acute pain of left knee  Acute pain of right knee     Discharge Instructions      Advised patient x-rays were negative for acute fracture and/or dislocation.  Advised patient he can RICE bilateral knees for 20 minutes 2-3 times daily for the next 3 to 4 days.     ED Prescriptions   None    PDMP not reviewed this encounter.   Trevor Iha, FNP 04/19/21 1337

## 2021-04-19 NOTE — Discharge Instructions (Addendum)
Advised patient x-rays were negative for acute fracture and/or dislocation.  Advised patient he can RICE bilateral knees for 20 minutes 2-3 times daily for the next 3 to 4 days.

## 2021-04-19 NOTE — Telephone Encounter (Signed)
Patient didn't want to see anyone else at the moment. States he will wait to see how he feels. Patient fell off ladder and pcp not in office. Will call back if he needs anything per patient.

## 2021-04-19 NOTE — ED Triage Notes (Signed)
Pt c/o fall that occurred yesterday. Pain to both knees, back and shoulders. Says he fell to knees by the swimming pool and twisted his back falling on LT shoulder. Needs RT shoulder knee replacement as well as RT shoulder. Sees pain management regularly. Pain 6/10

## 2021-04-27 ENCOUNTER — Ambulatory Visit: Payer: Self-pay | Admitting: Sports Medicine

## 2021-04-28 ENCOUNTER — Ambulatory Visit: Payer: Self-pay | Admitting: Sports Medicine

## 2021-05-06 ENCOUNTER — Ambulatory Visit: Payer: Self-pay | Admitting: Sports Medicine

## 2021-05-06 ENCOUNTER — Ambulatory Visit (INDEPENDENT_AMBULATORY_CARE_PROVIDER_SITE_OTHER): Payer: Self-pay

## 2021-05-06 ENCOUNTER — Other Ambulatory Visit: Payer: Self-pay

## 2021-05-06 DIAGNOSIS — F419 Anxiety disorder, unspecified: Secondary | ICD-10-CM

## 2021-05-06 DIAGNOSIS — M546 Pain in thoracic spine: Secondary | ICD-10-CM

## 2021-05-06 DIAGNOSIS — W11XXXA Fall on and from ladder, initial encounter: Secondary | ICD-10-CM

## 2021-05-06 DIAGNOSIS — M5416 Radiculopathy, lumbar region: Secondary | ICD-10-CM

## 2021-05-06 DIAGNOSIS — E6609 Other obesity due to excess calories: Secondary | ICD-10-CM

## 2021-05-06 DIAGNOSIS — F32A Depression, unspecified: Secondary | ICD-10-CM

## 2021-05-06 DIAGNOSIS — M545 Low back pain, unspecified: Secondary | ICD-10-CM

## 2021-05-06 DIAGNOSIS — R0781 Pleurodynia: Secondary | ICD-10-CM

## 2021-05-06 MED ORDER — PREDNISONE 50 MG PO TABS: ORAL_TABLET | ORAL | 0 refills | Status: DC

## 2021-05-06 MED ORDER — PHENTERMINE HCL 37.5 MG PO TABS: ORAL_TABLET | ORAL | 0 refills | Status: DC

## 2021-05-06 MED ORDER — TOPIRAMATE 100 MG PO TABS: 100.0000 mg | ORAL_TABLET | Freq: Two times a day (BID) | ORAL | 3 refills | Status: DC

## 2021-05-06 NOTE — Assessment & Plan Note (Signed)
Jonathan Villarreal has remained about the same weight, we are entering the sixth month, refilling phentermine and Topamax. He did have some significant life stressors over the past month.

## 2021-05-06 NOTE — Assessment & Plan Note (Signed)
Angello also had a fall, he was getting out of his aboveground swimming pool with his back facing the pool ladder, he slipped, knee gave out and he fell onto the ground.  He had pain in his right sided posterior rib cage, thoracolumbar spine. He was seen in urgent care, still having significant pain 2 to 3 weeks later so we will add x-rays of his lumbar spine, thoracic spine, right rib cage. Also adding 5 days of prednisone. He is currently getting tramadol and hydrocodone from his pain provider, they probably will need to go up on the dose for the next week or 2. He will contact them.

## 2021-05-06 NOTE — Assessment & Plan Note (Signed)
Brendon has multiple stressors, his father suffering from Alzheimer's disease and had moved into the house, we upped his Zoloft to 75 mg daily at the last visit, he improved considerably until more recently, his landlord is affecting he and his wife, and they may have to go live with their son. He desires no changes in his medication dose for now.

## 2021-05-06 NOTE — Progress Notes (Signed)
    Procedures performed today:    None.  Independent interpretation of notes and tests performed by another provider:   None.  Brief History, Exam, Impression, and Recommendations:    Obesity Jonathan Villarreal has remained about the same weight, we are entering the sixth month, refilling phentermine and Topamax. He did have some significant life stressors over the past month.  Bilateral lumbar radiculopathy Jonathan Villarreal also had a fall, he was getting out of his aboveground swimming pool with his back facing the pool ladder, he slipped, knee gave out and he fell onto the ground.  He had pain in his right sided posterior rib cage, thoracolumbar spine. He was seen in urgent care, still having significant pain 2 to 3 weeks later so we will add x-rays of his lumbar spine, thoracic spine, right rib cage. Also adding 5 days of prednisone. He is currently getting tramadol and hydrocodone from his pain provider, they probably will need to go up on the dose for the next week or 2. He will contact them.  Anxiety and depression Jonathan Villarreal has multiple stressors, his father suffering from Alzheimer's disease and had moved into the house, we upped his Zoloft to 75 mg daily at the last visit, he improved considerably until more recently, his landlord is affecting he and his wife, and they may have to go live with their son. He desires no changes in his medication dose for now.    ___________________________________________ Jonathan Villarreal. Jonathan Villarreal, M.D., ABFM., CAQSM. Primary Care and Sports Medicine [facility]  Adjunct Instructor of Family Medicine  [employer]

## 2021-08-11 ENCOUNTER — Ambulatory Visit (INDEPENDENT_AMBULATORY_CARE_PROVIDER_SITE_OTHER): Payer: Self-pay

## 2021-08-11 ENCOUNTER — Other Ambulatory Visit: Payer: Self-pay

## 2021-08-11 ENCOUNTER — Ambulatory Visit (INDEPENDENT_AMBULATORY_CARE_PROVIDER_SITE_OTHER): Payer: Self-pay | Admitting: Sports Medicine

## 2021-08-11 VITALS — Wt 286.9 lb

## 2021-08-11 DIAGNOSIS — M170 Bilateral primary osteoarthritis of knee: Secondary | ICD-10-CM

## 2021-08-11 DIAGNOSIS — Z0000 Encounter for general adult medical examination without abnormal findings: Secondary | ICD-10-CM

## 2021-08-11 DIAGNOSIS — M25562 Pain in left knee: Secondary | ICD-10-CM

## 2021-08-11 DIAGNOSIS — M7542 Impingement syndrome of left shoulder: Secondary | ICD-10-CM

## 2021-08-11 DIAGNOSIS — M25561 Pain in right knee: Secondary | ICD-10-CM

## 2021-08-11 DIAGNOSIS — M25512 Pain in left shoulder: Secondary | ICD-10-CM

## 2021-08-11 DIAGNOSIS — M12812 Other specific arthropathies, not elsewhere classified, left shoulder: Secondary | ICD-10-CM | POA: Insufficient documentation

## 2021-08-11 DIAGNOSIS — M75121 Complete rotator cuff tear or rupture of right shoulder, not specified as traumatic: Secondary | ICD-10-CM

## 2021-08-11 DIAGNOSIS — Z23 Encounter for immunization: Secondary | ICD-10-CM

## 2021-08-11 DIAGNOSIS — M75102 Unspecified rotator cuff tear or rupture of left shoulder, not specified as traumatic: Secondary | ICD-10-CM | POA: Insufficient documentation

## 2021-08-11 MED ORDER — MELOXICAM 15 MG PO TABS: ORAL_TABLET | ORAL | 3 refills | Status: DC

## 2021-08-11 NOTE — Progress Notes (Signed)
    Procedures performed today:    None.  Independent interpretation of notes and tests performed by another provider:   None.  Brief History, Exam, Impression, and Recommendations:    Rotator cuff tear, right History of rotator cuff repair, currently managed by his surgeon, they are telling him he needs a total shoulder arthroplasty now.  Primary osteoarthritis of both knees Recurrence of bilateral knee pain, restarting meloxicam, his left knee was last injected in October of last year, no other intervention, I would like updated x-rays, home conditioning exercises. Return to see me in 4 to 6 weeks, injections if not better.  Impingement syndrome, shoulder, left Increasing pain left, localized over the deltoid, positive impingement signs on exam. Starting conservatively, x-rays, home conditioning, meloxicam, return to see me in 4 to 6 weeks, subacromial injection if no better.  Annual physical exam Flu shot today.    ___________________________________________ Ihor Austin. Benjamin Stain, M.D., ABFM., CAQSM. Primary Care and Sports Medicine [facility]  Adjunct Instructor of Family Medicine  [employer]

## 2021-08-11 NOTE — Assessment & Plan Note (Signed)
History of rotator cuff repair, currently managed by his surgeon, they are telling him he needs a total shoulder arthroplasty now.

## 2021-08-11 NOTE — Addendum Note (Signed)
Addended by: Gaynelle Arabian on: 08/11/2021 02:32 PM   Modules accepted: Orders

## 2021-08-11 NOTE — Assessment & Plan Note (Signed)
Recurrence of bilateral knee pain, restarting meloxicam, his left knee was last injected in October of last year, no other intervention, I would like updated x-rays, home conditioning exercises. Return to see me in 4 to 6 weeks, injections if not better.

## 2021-08-11 NOTE — Assessment & Plan Note (Signed)
Flu shot today 

## 2021-08-11 NOTE — Assessment & Plan Note (Signed)
Increasing pain left, localized over the deltoid, positive impingement signs on exam. Starting conservatively, x-rays, home conditioning, meloxicam, return to see me in 4 to 6 weeks, subacromial injection if no better.

## 2021-09-15 ENCOUNTER — Ambulatory Visit (INDEPENDENT_AMBULATORY_CARE_PROVIDER_SITE_OTHER): Payer: Self-pay

## 2021-09-15 ENCOUNTER — Ambulatory Visit: Payer: Self-pay | Admitting: Sports Medicine

## 2021-09-15 ENCOUNTER — Other Ambulatory Visit: Payer: Self-pay

## 2021-09-15 DIAGNOSIS — M170 Bilateral primary osteoarthritis of knee: Secondary | ICD-10-CM

## 2021-09-15 MED ORDER — IBUPROFEN 800 MG PO TABS: 800.0000 mg | ORAL_TABLET | Freq: Three times a day (TID) | ORAL | 2 refills | Status: DC | PRN

## 2021-09-15 NOTE — Assessment & Plan Note (Signed)
This is a pleasant 51 year old male, bilateral knee pain, known osteoarthritis, last injection October 2021. Meloxicam was not effective, tramadol max dose prescribed with pain management not effective. Today we did bilateral knee joint injections, right knee was prepped with a compressive dressing. As meloxicam ineffective switching to plain ibuprofen 800 mg 3 times daily.

## 2021-09-15 NOTE — Progress Notes (Signed)
° ° °  Procedures performed today:    Procedure: Real-time Ultrasound Guided injection of the left knee Device: Samsung HS60  Verbal informed consent obtained.  Time-out conducted.  Noted no overlying erythema, induration, or other signs of local infection.  Skin prepped in a sterile fashion.  Local anesthesia: Topical Ethyl chloride.  With sterile technique and under real time ultrasound guidance: Noted trace effusion, 1 cc Kenalog 40, 2 cc lidocaine, 2 cc bupivacaine injected easily Completed without difficulty  Advised to call if fevers/chills, erythema, induration, drainage, or persistent bleeding.  Images permanently stored and available for review in PACS.  Impression: Technically successful ultrasound guided injection.  Procedure: Real-time Ultrasound Guided injection of the right knee Device: Samsung HS60  Verbal informed consent obtained.  Time-out conducted.  Noted no overlying erythema, induration, or other signs of local infection.  Skin prepped in a sterile fashion.  Local anesthesia: Topical Ethyl chloride.  With sterile technique and under real time ultrasound guidance: Noted trace effusion, 1 cc Kenalog 40, 2 cc lidocaine, 2 cc bupivacaine injected easily Completed without difficulty  Advised to call if fevers/chills, erythema, induration, drainage, or persistent bleeding.  Images permanently stored and available for review in PACS.  Impression: Technically successful ultrasound guided injection.  Independent interpretation of notes and tests performed by another provider:   None.  Brief History, Exam, Impression, and Recommendations:    Primary osteoarthritis of both knees This is a pleasant 51 year old male, bilateral knee pain, known osteoarthritis, last injection October 2021. Meloxicam was not effective, tramadol max dose prescribed with pain management not effective. Today we did bilateral knee joint injections, right knee was prepped with a compressive  dressing. As meloxicam ineffective switching to plain ibuprofen 800 mg 3 times daily.    ___________________________________________ Ihor Austin. Benjamin Stain, M.D., ABFM., CAQSM. Primary Care and Sports Medicine [facility]  Adjunct Instructor of Family Medicine  [employer]

## 2021-09-16 ENCOUNTER — Encounter: Payer: Self-pay | Admitting: Sports Medicine

## 2021-09-17 NOTE — Telephone Encounter (Signed)
Emailed Rayfield Citizen to let her know patient would need custom brace.

## 2021-09-17 NOTE — Telephone Encounter (Signed)
Per patient request please contact Thurmond Butts or Chrys Racer with [employer] so the patient can be set up with a custom-type OA brace, the standard reaction type braces do not fit his thick thighs and skinny calves.

## 2021-12-18 ENCOUNTER — Other Ambulatory Visit: Payer: Self-pay | Admitting: Sports Medicine

## 2022-01-04 ENCOUNTER — Ambulatory Visit: Payer: Self-pay | Admitting: Sports Medicine

## 2022-01-12 ENCOUNTER — Encounter: Payer: Self-pay | Admitting: Sports Medicine

## 2022-01-12 ENCOUNTER — Other Ambulatory Visit: Payer: Self-pay | Admitting: Sports Medicine

## 2022-01-12 ENCOUNTER — Ambulatory Visit (INDEPENDENT_AMBULATORY_CARE_PROVIDER_SITE_OTHER): Payer: Medicare Other

## 2022-01-12 ENCOUNTER — Ambulatory Visit (INDEPENDENT_AMBULATORY_CARE_PROVIDER_SITE_OTHER): Payer: Medicare Other | Admitting: Sports Medicine

## 2022-01-12 VITALS — BP 109/69 | HR 91 | Ht 66.0 in | Wt 306.0 lb

## 2022-01-12 DIAGNOSIS — Z0000 Encounter for general adult medical examination without abnormal findings: Secondary | ICD-10-CM

## 2022-01-12 DIAGNOSIS — E8881 Metabolic syndrome: Secondary | ICD-10-CM

## 2022-01-12 DIAGNOSIS — M75122 Complete rotator cuff tear or rupture of left shoulder, not specified as traumatic: Secondary | ICD-10-CM | POA: Diagnosis not present

## 2022-01-12 DIAGNOSIS — E782 Mixed hyperlipidemia: Secondary | ICD-10-CM

## 2022-01-12 DIAGNOSIS — M75101 Unspecified rotator cuff tear or rupture of right shoulder, not specified as traumatic: Secondary | ICD-10-CM

## 2022-01-12 DIAGNOSIS — M12811 Other specific arthropathies, not elsewhere classified, right shoulder: Secondary | ICD-10-CM | POA: Diagnosis not present

## 2022-01-12 DIAGNOSIS — M75121 Complete rotator cuff tear or rupture of right shoulder, not specified as traumatic: Secondary | ICD-10-CM

## 2022-01-12 DIAGNOSIS — R739 Hyperglycemia, unspecified: Secondary | ICD-10-CM | POA: Diagnosis not present

## 2022-01-12 DIAGNOSIS — F419 Anxiety disorder, unspecified: Secondary | ICD-10-CM | POA: Diagnosis not present

## 2022-01-12 DIAGNOSIS — F32A Depression, unspecified: Secondary | ICD-10-CM | POA: Diagnosis not present

## 2022-01-12 DIAGNOSIS — E6609 Other obesity due to excess calories: Secondary | ICD-10-CM

## 2022-01-12 DIAGNOSIS — M19012 Primary osteoarthritis, left shoulder: Secondary | ICD-10-CM | POA: Diagnosis not present

## 2022-01-12 MED ORDER — WEGOVY 0.25 MG/0.5ML ~~LOC~~ SOAJ: 0.2500 mg | SUBCUTANEOUS | 0 refills | Status: DC

## 2022-01-12 NOTE — Assessment & Plan Note (Signed)
Welcome to Medicare exam performed today, patient is up-to-date on screenings, we did his ECG, he is too young for AAA screening, we also did vision and hearing today. ?

## 2022-01-12 NOTE — Progress Notes (Addendum)
? ? ?Subjective:  ? Jonathan Villarreal is a 51 y.o. male who presents for a Welcome to Medicare exam.  ? ?Review of Systems: ?Comprehensive review of systems is negative ?  ? ?   ?Objective:  ?  ?Today's Vitals  ? 01/12/22 1434  ?BP: 109/69  ?Pulse: 91  ?SpO2: 97%  ?Weight: (!) 306 lb (138.8 kg)  ?Height: 5' 6"  (1.676 m)  ? ?Body mass index is 49.39 kg/m?. ? ?Medications ?Outpatient Encounter Medications as of 01/12/2022  ?Medication Sig  ? albuterol (PROVENTIL) (2.5 MG/3ML) 0.083% nebulizer solution Take 3 mLs (2.5 mg total) by nebulization every 4 (four) hours as needed for wheezing or shortness of breath (please include nebulizer machine, hoses, and mask if needed.).  ? Albuterol Sulfate (PROAIR RESPICLICK) 784 (90 Base) MCG/ACT AEPB Inhale 2 puffs into the lungs every 6 (six) hours as needed (cough, wheeze, SOB).  ? AMBULATORY NON FORMULARY MEDICATION Incentive spirometer to be used q2-3h.  ? amLODipine (NORVASC) 5 MG tablet TAKE 1 TABLET (5 MG TOTAL) BY MOUTH DAILY.  ? atorvastatin (LIPITOR) 10 MG tablet Take 1 tablet (10 mg total) by mouth daily at 6 PM.  ? ibuprofen (ADVIL) 800 MG tablet Take 1 tablet (800 mg total) by mouth every 8 (eight) hours as needed.  ? lisinopril-hydrochlorothiazide (ZESTORETIC) 20-25 MG tablet Take 1 tablet by mouth daily.  ? phentermine (ADIPEX-P) 37.5 MG tablet One-half tab by mouth qAM  ? Respiratory Therapy Supplies (NEBULIZER AIR TUBE/PLUGS) MISC Use as needed, tubing, mask  ? sertraline (ZOLOFT) 50 MG tablet Take 1.5 tablets (75 mg total) by mouth daily.  ? sildenafil (VIAGRA) 50 MG tablet TAKE ONE TABLET BY MOUTH AS NEEDED FOR ERECTILE DYSFUNCTION  ? topiramate (TOPAMAX) 100 MG tablet Take 1 tablet (100 mg total) by mouth 2 (two) times daily.  ? WEGOVY 0.25 MG/0.5ML SOAJ Inject 0.25 mg into the skin once a week. Use this dose for 1 month (4 shots) and then increase to next higher dose.  ? ?No facility-administered encounter medications on file as of 01/12/2022.  ?  ? ?History: ?Past  Medical History:  ?Diagnosis Date  ? Asthma   ? ?Past Surgical History:  ?Procedure Laterality Date  ? APPENDECTOMY    ?  ?Family History  ?Problem Relation Age of Onset  ? Diabetes Mother   ? Hypertension Father   ? ?Social History  ? ?Occupational History  ? Not on file  ?Tobacco Use  ? Smoking status: Former  ?  Packs/day: 0.50  ?  Types: Cigarettes  ? Smokeless tobacco: Never  ?Substance and Sexual Activity  ? Alcohol use: Yes  ?  Alcohol/week: 0.0 standard drinks  ? Drug use: No  ? Sexual activity: Not on file  ? ?Tobacco Counseling ?Counseling given: Not Answered ? ? ?Immunizations and Health Maintenance ?Immunization History  ?Administered Date(s) Administered  ? Influenza,inj,Quad PF,6+ Mos 05/11/2017, 05/26/2019, 06/19/2020, 08/11/2021  ? Influenza,inj,Quad PF,6-35 Mos 05/26/2019  ? Influenza-Unspecified 06/19/2015  ? MMR 04/21/2016  ? PFIZER(Purple Top)SARS-COV-2 Vaccination 11/02/2020, 11/23/2020  ? PPD Test 04/22/2016  ? Tdap 11/17/2014, 04/30/2017  ? ?Health Maintenance Due  ?Topic Date Due  ? HIV Screening  Never done  ? Hepatitis C Screening  Never done  ? COLONOSCOPY (Pts 45-67yr Insurance coverage will need to be confirmed)  Never done  ? COVID-19 Vaccine (3 - Booster for Pfizer series) 01/18/2021  ? ? ?Activities of Daily Living ?   ? View : No data to display.  ?  ?  ?  ? ? ?  Physical Exam cardiovascular exam unremarkable ? ?Advanced Directives: Patient will think about his desired CODE STATUS, he will let me know.  He will also think about and set up his beneficiary planning.  ? ?Twelve-lead ECG performed and interpreted, normal sinus rhythm with a rate of 80 bpm, there is some early repolarization and isoelectric in aVF/mild left axis deviation.  No PR abnormalities, no ST changes. ?   ?Assessment:  ?  ?This is a routine wellness  examination for this patient . Yes ? ?Vision/Hearing screen ?Hearing Screening  ?Method: Audiometry  ? 500Hz  2000Hz  4000Hz   ?Right ear Pass Pass Fail  ?Left ear Fail  Fail Pass  ? ?Vision Screening  ? Right eye Left eye Both eyes  ?Without correction 20/25 20/25 20/25   ?With correction     ? ? ?Dietary issues and exercise activities discussed:  ?  ? ? Goals   ?None ?  ?  ?Depression Screen ? ?  05/06/2021  ? 10:01 AM 02/02/2021  ?  8:42 AM 12/03/2020  ? 12:20 PM 03/19/2020  ? 10:02 AM  ?PHQ 2/9 Scores  ?PHQ - 2 Score 6 3 5  0  ?PHQ- 9 Score 21 10 20    ?  ? ?Fall Risk ?   ? View : No data to display.  ?  ?  ?  ? ? ?Cognitive Function ?  ?  ?  ? ?Patient Care Team: ?Silverio Decamp, MD as PCP - General (Family Medicine) ? ?   ?Plan:  ? Anxiety and depression ?Symptoms are well controlled on Zoloft 75, no changes. ? ?Annual physical exam ?Welcome to Medicare exam performed today, patient is up-to-date on screenings, we did his ECG, he is too young for AAA screening, we also did vision and hearing today. ? ?Obesity ?Jonathan Villarreal was on [place] in the past but it was not covered, we will go ahead and try to get [facility] approved again.  He will be part of a multidisciplinary weight loss program, with calorie counting, exercise, he has failed phentermine and Topamax. ? ?Rotator cuff tear arthropathy, right ?History of cuff repair, managed by a surgeon, they are telling him that he needs a shoulder arthroplasty, he is requesting an updated x-ray which I am happy to do now. ? ?Update: X-ray shows glenohumeral osteoarthritis, this will be the target for the injection at the follow-up. ? ? ?I have personally reviewed and noted the following in the patient?s chart:  ? ?Medical and social history ?Use of alcohol, tobacco or illicit drugs  ?Current medications and supplements ?Functional ability and status ?Nutritional status ?Physical activity ?Advanced directives ?List of other physicians ?Hospitalizations, surgeries, and ER visits in previous 12 months ?Vitals ?Screenings to include cognitive, depression, and falls ?Referrals and appointments ? ?In addition, I have reviewed and discussed with  patient certain preventive protocols, quality metrics, and best practice recommendations. A written personalized care plan for preventive services as well as general preventive health recommendations were provided to patient. ?  ? ?Aundria Mems, MD 01/13/2022 ? ? ? ? ? ?

## 2022-01-12 NOTE — Assessment & Plan Note (Signed)
Symptoms are well controlled on Zoloft 75, no changes. ?

## 2022-01-12 NOTE — Assessment & Plan Note (Signed)
Lantz was on [place] in the past but it was not covered, we will go ahead and try to get [facility] approved again.  He will be part of a multidisciplinary weight loss program, with calorie counting, exercise, he has failed phentermine and Topamax. ?

## 2022-01-12 NOTE — Addendum Note (Signed)
Addended by: Carolin Coy on: 01/12/2022 03:13 PM ? ? Modules accepted: Orders ? ?

## 2022-01-12 NOTE — Assessment & Plan Note (Addendum)
History of cuff repair, managed by a surgeon, they are telling him that he needs a shoulder arthroplasty, he is requesting an updated x-ray which I am happy to do now. ? ?Update: X-ray shows glenohumeral osteoarthritis, this will be the target for the injection at the follow-up. ?

## 2022-01-12 NOTE — Telephone Encounter (Signed)
We tried to get [facility] but we are getting a denial from the pharmacy, would you look into this and try a prior authorization, and if unable to get it maybe you can let me know of an alternative agent in the GLP-1 class. ?Thank you! ?

## 2022-01-15 ENCOUNTER — Other Ambulatory Visit: Payer: Self-pay | Admitting: Sports Medicine

## 2022-01-19 ENCOUNTER — Ambulatory Visit: Payer: Self-pay

## 2022-01-19 ENCOUNTER — Ambulatory Visit (INDEPENDENT_AMBULATORY_CARE_PROVIDER_SITE_OTHER): Payer: Medicare Other

## 2022-01-19 ENCOUNTER — Telehealth: Payer: Self-pay

## 2022-01-19 ENCOUNTER — Ambulatory Visit (INDEPENDENT_AMBULATORY_CARE_PROVIDER_SITE_OTHER): Payer: Medicare Other | Admitting: Sports Medicine

## 2022-01-19 DIAGNOSIS — M75102 Unspecified rotator cuff tear or rupture of left shoulder, not specified as traumatic: Secondary | ICD-10-CM

## 2022-01-19 DIAGNOSIS — M12812 Other specific arthropathies, not elsewhere classified, left shoulder: Secondary | ICD-10-CM | POA: Diagnosis not present

## 2022-01-19 DIAGNOSIS — M75101 Unspecified rotator cuff tear or rupture of right shoulder, not specified as traumatic: Secondary | ICD-10-CM

## 2022-01-19 DIAGNOSIS — M7542 Impingement syndrome of left shoulder: Secondary | ICD-10-CM

## 2022-01-19 DIAGNOSIS — M170 Bilateral primary osteoarthritis of knee: Secondary | ICD-10-CM | POA: Diagnosis not present

## 2022-01-19 DIAGNOSIS — R739 Hyperglycemia, unspecified: Secondary | ICD-10-CM | POA: Diagnosis not present

## 2022-01-19 DIAGNOSIS — M12811 Other specific arthropathies, not elsewhere classified, right shoulder: Secondary | ICD-10-CM

## 2022-01-19 DIAGNOSIS — E782 Mixed hyperlipidemia: Secondary | ICD-10-CM | POA: Diagnosis not present

## 2022-01-19 NOTE — Telephone Encounter (Addendum)
Initiated Prior authorization for: WEGOVY 0.25 MG/0.5 ML PEN ?Via: Covermymeds ?Case/Key:[id number] ?Status: denied as of 01/19/22 ?Reason:Under section 1860D-2(e)(2) of the Social Security Act (the Act), certain drugs are excluded from ?Medicare coverage or are excluded from coverage when used to treat certain medical conditions. ?Wegovy Inj 0.25mg  is one of the drugs that are excluded from Medicare coverage by law, and we do not ?offer the drug as a supplemental benefit. ?Notified Pt via: Mychart  ?

## 2022-01-19 NOTE — Progress Notes (Signed)
? ? ?  Procedures performed today:   ? ?Procedure: Real-time Ultrasound Guided injection of the left knee ?Device: Samsung HS60  ?Verbal informed consent obtained.  ?Time-out conducted.  ?Noted no overlying erythema, induration, or other signs of local infection.  ?Skin prepped in a sterile fashion.  ?Local anesthesia: Topical Ethyl chloride.  ?With sterile technique and under real time ultrasound guidance: Moderate effusion noted, 1 cc Kenalog 40, 2 cc lidocaine, 2 cc bupivacaine injected easily ?Completed without difficulty  ?Advised to call if fevers/chills, erythema, induration, drainage, or persistent bleeding.  ?Images permanently stored and available for review in PACS.  ?Impression: Technically successful ultrasound guided injection. ? ?Procedure: Real-time Ultrasound Guided injection of the right knee ?Device: Samsung HS60  ?Verbal informed consent obtained.  ?Time-out conducted.  ?Noted no overlying erythema, induration, or other signs of local infection.  ?Skin prepped in a sterile fashion.  ?Local anesthesia: Topical Ethyl chloride.  ?With sterile technique and under real time ultrasound guidance: No effusion noted, 1 cc Kenalog 40, 2 cc lidocaine, 2 cc bupivacaine injected easily ?Completed without difficulty  ?Advised to call if fevers/chills, erythema, induration, drainage, or persistent bleeding.  ?Images permanently stored and available for review in PACS.  ?Impression: Technically successful ultrasound guided injection. ? ?Procedure: Real-time Ultrasound Guided injection of the left glenohumeral joint ?Device: Samsung HS60  ?Verbal informed consent obtained.  ?Time-out conducted.  ?Noted no overlying erythema, induration, or other signs of local infection.  ?Skin prepped in a sterile fashion.  ?Local anesthesia: Topical Ethyl chloride.  ?With sterile technique and under real time ultrasound guidance: Effusion and arthritic joint noted, 1 cc Kenalog 40, 2 cc lidocaine, 2 cc bupivacaine injected  easily ?Completed without difficulty  ?Advised to call if fevers/chills, erythema, induration, drainage, or persistent bleeding.  ?Images permanently stored and available for review in PACS.  ?Impression: Technically successful ultrasound guided injection. ? ?Independent interpretation of notes and tests performed by another provider:  ? ?None. ? ?Brief History, Exam, Impression, and Recommendations:   ? ?Rotator cuff tear arthropathy, left ?Cuff tear arthropathy, glenohumeral arthritis on x-rays, injected the glenohumeral joint today, return to see me in 4 to 6 weeks as needed. ? ?Primary osteoarthritis of both knees ?Known bilateral knee osteoarthritis, last injection was January 2023, recurrence of pain, repeat bilateral injections today. ? ?Chronic process with exacerbation and pharmacologic intervention ? ?___________________________________________ ?Ihor Austin. Benjamin Stain, M.D., ABFM., CAQSM. ?Primary Care and Sports Medicine ?[facility] ? ?Adjunct Instructor of Family Medicine  ?[employer] ?

## 2022-01-19 NOTE — Telephone Encounter (Signed)
Pt received a denial ?Reason:Under section 1860D-2(e)(2) of the Social Security Act (the Act), certain drugs are excluded from ?Medicare coverage or are excluded from coverage when used to treat certain medical conditions. ?Wegovy Inj 0.25mg  is one of the drugs that are excluded from Medicare coverage by law, and we do not ?offer the drug as a supplemental benefit.  ?

## 2022-01-19 NOTE — Assessment & Plan Note (Signed)
Cuff tear arthropathy, glenohumeral arthritis on x-rays, injected the glenohumeral joint today, return to see me in 4 to 6 weeks as needed. ?

## 2022-01-19 NOTE — Assessment & Plan Note (Signed)
Known bilateral knee osteoarthritis, last injection was January 2023, recurrence of pain, repeat bilateral injections today. ?

## 2022-01-20 LAB — LIPID PANEL
Cholesterol: 210 mg/dL — ABNORMAL HIGH (ref <200)
HDL: 70 mg/dL (ref >=40)
LDL Cholesterol (Calc): 119 mg/dL (calc) — ABNORMAL HIGH
Non-HDL Cholesterol (Calc): 140 mg/dL (calc) — ABNORMAL HIGH (ref <130)
Total CHOL/HDL Ratio: 3 (calc) (ref <5.0)
Triglycerides: 105 mg/dL (ref <150)

## 2022-01-20 LAB — CBC
HCT: 43.5 % (ref 38.5–50.0)
Hemoglobin: 14.7 g/dL (ref 13.2–17.1)
MCH: 31.5 pg (ref 27.0–33.0)
MCHC: 33.8 g/dL (ref 32.0–36.0)
MCV: 93.1 fL (ref 80.0–100.0)
MPV: 9.8 fL (ref 7.5–12.5)
Platelets: 401 10*3/uL — ABNORMAL HIGH (ref 140–400)
RBC: 4.67 10*6/uL (ref 4.20–5.80)
RDW: 12 % (ref 11.0–15.0)
WBC: 8.3 10*3/uL (ref 3.8–10.8)

## 2022-01-20 LAB — COMPREHENSIVE METABOLIC PANEL
AG Ratio: 1.6 (calc) (ref 1.0–2.5)
ALT: 39 U/L (ref 9–46)
AST: 26 U/L (ref 10–40)
Albumin: 4.2 g/dL (ref 3.6–5.1)
Alkaline phosphatase (APISO): 79 U/L (ref 36–130)
BUN: 18 mg/dL (ref 7–25)
CO2: 23 mmol/L (ref 20–32)
Calcium: 9.8 mg/dL (ref 8.6–10.3)
Chloride: 104 mmol/L (ref 98–110)
Creat: 0.64 mg/dL (ref 0.60–1.29)
Globulin: 2.6 g/dL (calc) (ref 1.9–3.7)
Glucose, Bld: 91 mg/dL (ref 65–99)
Potassium: 4 mmol/L (ref 3.5–5.3)
Sodium: 138 mmol/L (ref 135–146)
Total Bilirubin: 0.5 mg/dL (ref 0.2–1.2)
Total Protein: 6.8 g/dL (ref 6.1–8.1)

## 2022-01-20 LAB — HEMOGLOBIN A1C
Hgb A1c MFr Bld: 5.1 % of total Hgb (ref <5.7)
Mean Plasma Glucose: 100 mg/dL
eAG (mmol/L): 5.5 mmol/L

## 2022-01-20 LAB — TSH: TSH: 1.4 mIU/L (ref 0.40–4.50)

## 2022-02-15 ENCOUNTER — Other Ambulatory Visit: Payer: Self-pay | Admitting: Sports Medicine

## 2022-02-15 ENCOUNTER — Ambulatory Visit (INDEPENDENT_AMBULATORY_CARE_PROVIDER_SITE_OTHER): Payer: Medicare Other | Admitting: Sports Medicine

## 2022-02-15 ENCOUNTER — Encounter: Payer: Self-pay | Admitting: Sports Medicine

## 2022-02-15 DIAGNOSIS — E8881 Metabolic syndrome: Secondary | ICD-10-CM

## 2022-02-15 DIAGNOSIS — J4520 Mild intermittent asthma, uncomplicated: Secondary | ICD-10-CM | POA: Diagnosis not present

## 2022-02-15 DIAGNOSIS — E669 Obesity, unspecified: Secondary | ICD-10-CM | POA: Diagnosis not present

## 2022-02-15 DIAGNOSIS — M170 Bilateral primary osteoarthritis of knee: Secondary | ICD-10-CM

## 2022-02-15 MED ORDER — ALBUTEROL SULFATE (2.5 MG/3ML) 0.083% IN NEBU: 2.5000 mg | INHALATION_SOLUTION | RESPIRATORY_TRACT | 11 refills | Status: DC | PRN

## 2022-02-15 MED ORDER — INDOMETHACIN 50 MG PO CAPS: 50.0000 mg | ORAL_CAPSULE | Freq: Two times a day (BID) | ORAL | 3 refills | Status: DC

## 2022-02-15 MED ORDER — MOUNJARO 2.5 MG/0.5ML ~~LOC~~ SOAJ: 2.5000 mg | SUBCUTANEOUS | 0 refills | Status: DC

## 2022-02-15 MED ORDER — BUDESONIDE-FORMOTEROL FUMARATE 160-4.5 MCG/ACT IN AERO: 1.0000 | INHALATION_SPRAY | Freq: Two times a day (BID) | RESPIRATORY_TRACT | 3 refills | Status: DC

## 2022-02-15 MED ORDER — PROAIR RESPICLICK 108 (90 BASE) MCG/ACT IN AEPB: 2.0000 | INHALATION_SPRAY | Freq: Four times a day (QID) | RESPIRATORY_TRACT | 11 refills | Status: DC | PRN

## 2022-02-15 MED ORDER — NEBULIZER AIR TUBE/PLUGS MISC: 0 refills | Status: DC

## 2022-02-15 NOTE — Progress Notes (Signed)
    Procedures performed today:    None.  Independent interpretation of notes and tests performed by another provider:   None.  Brief History, Exam, Impression, and Recommendations:    Abdominal obesity and metabolic syndrome Unable to get [facility] with Medicare. We will try [name].  We will use a diagnosis of metabolic syndrome.  Asthma, mild intermittent Jonathan Villarreal does have asthma, may be more of a mild persistent considering nearly daily use of albuterol. We will add Symbicort, and refill his albuterol in the inhaler and nebulizer type solution. We can follow this up in a month or so    ___________________________________________ Gwen Her. Dianah Field, M.D., ABFM., CAQSM. Primary Care and [place] Instructor of [place] [employer] [facility] of Medicine

## 2022-02-15 NOTE — Assessment & Plan Note (Signed)
Jonathan Villarreal does have asthma, may be more of a mild persistent considering nearly daily use of albuterol. We will add Symbicort, and refill his albuterol in the inhaler and nebulizer type solution. We can follow this up in a month or so

## 2022-02-15 NOTE — Assessment & Plan Note (Signed)
Unable to get [facility] with Medicare. We will try [name].  We will use a diagnosis of metabolic syndrome.

## 2022-03-04 ENCOUNTER — Other Ambulatory Visit: Payer: Self-pay | Admitting: Sports Medicine

## 2022-03-04 DIAGNOSIS — I10 Essential (primary) hypertension: Secondary | ICD-10-CM

## 2022-03-04 DIAGNOSIS — M170 Bilateral primary osteoarthritis of knee: Secondary | ICD-10-CM

## 2022-03-14 ENCOUNTER — Other Ambulatory Visit: Payer: Self-pay | Admitting: Sports Medicine

## 2022-03-14 DIAGNOSIS — E669 Obesity, unspecified: Secondary | ICD-10-CM

## 2022-03-17 DIAGNOSIS — M47816 Spondylosis without myelopathy or radiculopathy, lumbar region: Secondary | ICD-10-CM | POA: Diagnosis not present

## 2022-03-17 DIAGNOSIS — G894 Chronic pain syndrome: Secondary | ICD-10-CM | POA: Diagnosis not present

## 2022-03-17 DIAGNOSIS — M170 Bilateral primary osteoarthritis of knee: Secondary | ICD-10-CM | POA: Diagnosis not present

## 2022-03-29 ENCOUNTER — Other Ambulatory Visit: Payer: Self-pay | Admitting: Sports Medicine

## 2022-03-29 DIAGNOSIS — I10 Essential (primary) hypertension: Secondary | ICD-10-CM

## 2022-04-15 ENCOUNTER — Other Ambulatory Visit: Payer: Self-pay | Admitting: Sports Medicine

## 2022-04-29 ENCOUNTER — Other Ambulatory Visit: Payer: Self-pay | Admitting: Sports Medicine

## 2022-05-18 ENCOUNTER — Other Ambulatory Visit: Payer: Self-pay | Admitting: Sports Medicine

## 2022-05-18 DIAGNOSIS — E669 Obesity, unspecified: Secondary | ICD-10-CM

## 2022-05-26 ENCOUNTER — Ambulatory Visit: Payer: Medicare Other | Admitting: Sports Medicine

## 2022-05-26 ENCOUNTER — Other Ambulatory Visit: Payer: Self-pay | Admitting: Sports Medicine

## 2022-05-26 DIAGNOSIS — Z9189 Other specified personal risk factors, not elsewhere classified: Secondary | ICD-10-CM

## 2022-05-26 DIAGNOSIS — N529 Male erectile dysfunction, unspecified: Secondary | ICD-10-CM

## 2022-05-26 NOTE — Progress Notes (Addendum)
[facility])                                            [facility] Quality Pharmacy Team                                        Statin Quality Measure Assessment    05/26/2022  Jonathan Villarreal 21-May-1971 [id number]  Per review of chart and payor information, this patient has been flagged for non-adherence to the following CMS Quality Measure:   [x]  Statin Use in Persons with Diabetes  A1c 5 and 5.1%. ASCVD 1.7%. [name], for weightloss, flagged patient as diabetic due to fill of diabetes medication. LDL 119. Patient denied for [facility] in 2023. Please consider Saxenda or Wegovy, if clinically appropriate,  for weight loss as patient will not fail SUPD measure for non-diabetic patient.   []  Statin Use in Persons with Cardiovascular Disease  The 10-year ASCVD risk score (Arnett DK, et al., 2019) is: 1.7%   Values used to calculate the score:     Age: 51 years     Sex: Male     Is Non-Hispanic African American: No     Diabetic: No     Tobacco smoker: No     Systolic Blood Pressure: 99 mmHg     Is BP treated: Yes     HDL Cholesterol: 70 mg/dL     Total Cholesterol: 210 mg/dL    Please consider ONE of the following recommendations:   Initiate high intensity statin Atorvastatin 40mg  once daily, #90, 3 refills   Rosuvastatin 20mg  once daily, #90, 3 refills    Initiate moderate intensity          statin with reduced frequency if prior          statin intolerance 1x weekly, #13, 3 refills   2x weekly, #26, 3 refills   3x weekly, #39, 3 refills   Code for past statin intolerance or other exclusions (required annually)  Drug Induced Myopathy G72.0   Myositis, unspecified M60.9   Rhabdomyolysis M62.82   Cirrhosis of liver K74.69   Biliary cirrhosis, unspecified K74.5   Abnormal blood glucose - for SUPD ONLY R73.09   Prediabetes - for SUPD ONLY  R73.03   Thank you for your time,  2020, PharmD Clinical Pharmacist [facility] Cell: [phone]

## 2022-05-27 ENCOUNTER — Telehealth: Payer: Self-pay | Admitting: Sports Medicine

## 2022-05-27 ENCOUNTER — Ambulatory Visit: Payer: Medicare Other | Admitting: Sports Medicine

## 2022-05-27 NOTE — Telephone Encounter (Signed)
Pt called at 10:17.  He got time for his appt mixed up.

## 2022-05-31 ENCOUNTER — Encounter: Payer: Self-pay | Admitting: Sports Medicine

## 2022-05-31 ENCOUNTER — Ambulatory Visit (INDEPENDENT_AMBULATORY_CARE_PROVIDER_SITE_OTHER): Payer: Medicare Other | Admitting: Sports Medicine

## 2022-05-31 DIAGNOSIS — M75102 Unspecified rotator cuff tear or rupture of left shoulder, not specified as traumatic: Secondary | ICD-10-CM | POA: Diagnosis not present

## 2022-05-31 DIAGNOSIS — M12812 Other specific arthropathies, not elsewhere classified, left shoulder: Secondary | ICD-10-CM

## 2022-05-31 MED ORDER — NALTREXONE-BUPROPION HCL ER 8-90 MG PO TB12: ORAL_TABLET | ORAL | 0 refills | Status: DC

## 2022-05-31 NOTE — Assessment & Plan Note (Signed)
Jonathan Villarreal returns, he is a very pleasant 51 year old male, he has rotator cuff tear arthropathy with glenohumeral osteoarthritis on x-rays, he is post arthroscopic debridement right shoulder and doing well, at the last visit back in May we injected his glenohumeral joint, unfortunately he only got a few weeks of relief and has a recurrence of pain. He is looking for repeat arthroscopy, but he would like somebody in [place], we will do the referral to [place].

## 2022-05-31 NOTE — Assessment & Plan Note (Signed)
Jonathan Villarreal returns, we have tried multiple modalities to help him lose weight, we were unable to get [name] or [name] with his insurance, we will go ahead and try [name], if unable to get branded [name] I will simply call in the 2 separate generics.  He he will be enrolled in a multidisciplinary weight loss program with calorie counting, as well as an exercise prescription.

## 2022-05-31 NOTE — Progress Notes (Signed)
    Procedures performed today:    None.  Independent interpretation of notes and tests performed by another provider:   None.  Brief History, Exam, Impression, and Recommendations:    Rotator cuff tear arthropathy, left Jonathan Villarreal, he is a very pleasant 51 year old male, he has rotator cuff tear arthropathy with glenohumeral osteoarthritis on x-rays, he is post arthroscopic debridement right shoulder and doing well, at the last visit back in May we injected his glenohumeral joint, unfortunately he only got a few weeks of relief and has a recurrence of pain. He is looking for repeat arthroscopy, but he would like somebody in [place], we will do the referral to [place].  Morbid obesity (HCC) Jonathan Villarreal, we have tried multiple modalities to help him lose weight, we were unable to get [name] or [name] with his insurance, we will go ahead and try [name], if unable to get branded [name] I will simply call in the 2 separate generics.  He he will be enrolled in a multidisciplinary weight loss program with calorie counting, as well as an exercise prescription.  Chronic process with exacerbation/not at goal with pharmacologic intervention  ____________________________________________ Jonathan Villarreal. Dianah Field, M.D., ABFM., CAQSM., AME. Primary Care and Sports Medicine [facility]  Adjunct Professor of [place] [employer] [facility] [employer] of Medicine  Risk manager

## 2022-06-08 ENCOUNTER — Encounter (INDEPENDENT_AMBULATORY_CARE_PROVIDER_SITE_OTHER): Payer: Medicare Other | Admitting: Sports Medicine

## 2022-06-08 MED ORDER — BUPROPION HCL ER (XL) 150 MG PO TB24: ORAL_TABLET | ORAL | 3 refills | Status: DC

## 2022-06-08 MED ORDER — NALTREXONE HCL 50 MG PO TABS: ORAL_TABLET | ORAL | 3 refills | Status: DC

## 2022-06-08 NOTE — Telephone Encounter (Signed)
I spent 5 total minutes of online digital evaluation and management services in this patient-initiated request for online care. 

## 2022-06-13 DIAGNOSIS — M75102 Unspecified rotator cuff tear or rupture of left shoulder, not specified as traumatic: Secondary | ICD-10-CM | POA: Diagnosis not present

## 2022-06-13 DIAGNOSIS — G8929 Other chronic pain: Secondary | ICD-10-CM | POA: Diagnosis not present

## 2022-06-13 DIAGNOSIS — M25512 Pain in left shoulder: Secondary | ICD-10-CM | POA: Diagnosis not present

## 2022-06-15 DIAGNOSIS — M75112 Incomplete rotator cuff tear or rupture of left shoulder, not specified as traumatic: Secondary | ICD-10-CM | POA: Diagnosis not present

## 2022-06-15 DIAGNOSIS — M67912 Unspecified disorder of synovium and tendon, left shoulder: Secondary | ICD-10-CM | POA: Diagnosis not present

## 2022-06-15 DIAGNOSIS — M19012 Primary osteoarthritis, left shoulder: Secondary | ICD-10-CM | POA: Diagnosis not present

## 2022-06-15 DIAGNOSIS — M25712 Osteophyte, left shoulder: Secondary | ICD-10-CM | POA: Diagnosis not present

## 2022-06-17 DIAGNOSIS — M170 Bilateral primary osteoarthritis of knee: Secondary | ICD-10-CM | POA: Diagnosis not present

## 2022-06-17 DIAGNOSIS — M25512 Pain in left shoulder: Secondary | ICD-10-CM | POA: Diagnosis not present

## 2022-06-17 DIAGNOSIS — M47816 Spondylosis without myelopathy or radiculopathy, lumbar region: Secondary | ICD-10-CM | POA: Diagnosis not present

## 2022-06-17 DIAGNOSIS — M25511 Pain in right shoulder: Secondary | ICD-10-CM | POA: Diagnosis not present

## 2022-06-17 DIAGNOSIS — G894 Chronic pain syndrome: Secondary | ICD-10-CM | POA: Diagnosis not present

## 2022-06-17 DIAGNOSIS — Z79891 Long term (current) use of opiate analgesic: Secondary | ICD-10-CM | POA: Diagnosis not present

## 2022-06-17 DIAGNOSIS — G8929 Other chronic pain: Secondary | ICD-10-CM | POA: Diagnosis not present

## 2022-06-17 MED ORDER — BUPROPION HCL ER (XL) 150 MG PO TB24: ORAL_TABLET | ORAL | 3 refills | Status: DC

## 2022-06-17 MED ORDER — NALTREXONE HCL 50 MG PO TABS: ORAL_TABLET | ORAL | 3 refills | Status: DC

## 2022-06-17 NOTE — Addendum Note (Signed)
Addended by: Narda Rutherford on: 06/17/2022 09:52 AM   Modules accepted: Orders

## 2022-06-21 ENCOUNTER — Encounter: Payer: Self-pay | Admitting: Sports Medicine

## 2022-06-21 DIAGNOSIS — M2550 Pain in unspecified joint: Secondary | ICD-10-CM | POA: Diagnosis not present

## 2022-06-21 DIAGNOSIS — M25511 Pain in right shoulder: Secondary | ICD-10-CM | POA: Diagnosis not present

## 2022-06-21 DIAGNOSIS — M25562 Pain in left knee: Secondary | ICD-10-CM | POA: Diagnosis not present

## 2022-06-21 DIAGNOSIS — G894 Chronic pain syndrome: Secondary | ICD-10-CM | POA: Diagnosis not present

## 2022-06-21 DIAGNOSIS — Z79899 Other long term (current) drug therapy: Secondary | ICD-10-CM | POA: Diagnosis not present

## 2022-06-22 ENCOUNTER — Encounter: Payer: Self-pay | Admitting: Sports Medicine

## 2022-06-29 DIAGNOSIS — M19012 Primary osteoarthritis, left shoulder: Secondary | ICD-10-CM | POA: Diagnosis not present

## 2022-07-18 ENCOUNTER — Other Ambulatory Visit: Payer: Self-pay | Admitting: Sports Medicine

## 2022-07-19 ENCOUNTER — Other Ambulatory Visit: Payer: Self-pay | Admitting: Sports Medicine

## 2022-07-19 DIAGNOSIS — I10 Essential (primary) hypertension: Secondary | ICD-10-CM

## 2022-07-20 ENCOUNTER — Other Ambulatory Visit: Payer: Self-pay | Admitting: Sports Medicine

## 2022-07-20 DIAGNOSIS — N529 Male erectile dysfunction, unspecified: Secondary | ICD-10-CM

## 2022-07-21 NOTE — Telephone Encounter (Signed)
He's scheduled for 08/03/2022 @10 :05. tvt

## 2022-08-03 ENCOUNTER — Encounter: Payer: Medicare Other | Admitting: Sports Medicine

## 2022-08-08 ENCOUNTER — Other Ambulatory Visit: Payer: Self-pay | Admitting: Sports Medicine

## 2022-08-08 DIAGNOSIS — M170 Bilateral primary osteoarthritis of knee: Secondary | ICD-10-CM

## 2022-08-15 ENCOUNTER — Encounter: Payer: Medicare Other | Admitting: Sports Medicine

## 2022-09-29 DIAGNOSIS — I959 Hypotension, unspecified: Secondary | ICD-10-CM | POA: Diagnosis not present

## 2022-09-29 DIAGNOSIS — Z043 Encounter for examination and observation following other accident: Secondary | ICD-10-CM | POA: Diagnosis not present

## 2022-09-29 DIAGNOSIS — M545 Low back pain, unspecified: Secondary | ICD-10-CM | POA: Diagnosis not present

## 2022-09-29 DIAGNOSIS — R079 Chest pain, unspecified: Secondary | ICD-10-CM | POA: Diagnosis not present

## 2022-09-29 DIAGNOSIS — M542 Cervicalgia: Secondary | ICD-10-CM | POA: Diagnosis not present

## 2022-09-29 DIAGNOSIS — R0789 Other chest pain: Secondary | ICD-10-CM | POA: Diagnosis not present

## 2022-09-29 DIAGNOSIS — M25552 Pain in left hip: Secondary | ICD-10-CM | POA: Diagnosis not present

## 2022-09-29 DIAGNOSIS — Y999 Unspecified external cause status: Secondary | ICD-10-CM | POA: Diagnosis not present

## 2022-09-29 DIAGNOSIS — S20312A Abrasion of left front wall of thorax, initial encounter: Secondary | ICD-10-CM | POA: Diagnosis not present

## 2022-09-29 DIAGNOSIS — M160 Bilateral primary osteoarthritis of hip: Secondary | ICD-10-CM | POA: Diagnosis not present

## 2022-09-29 DIAGNOSIS — M25551 Pain in right hip: Secondary | ICD-10-CM | POA: Diagnosis not present

## 2022-09-29 DIAGNOSIS — Y92410 Unspecified street and highway as the place of occurrence of the external cause: Secondary | ICD-10-CM | POA: Diagnosis not present

## 2022-09-29 DIAGNOSIS — I10 Essential (primary) hypertension: Secondary | ICD-10-CM | POA: Diagnosis not present

## 2022-09-29 DIAGNOSIS — R0602 Shortness of breath: Secondary | ICD-10-CM | POA: Diagnosis not present

## 2022-09-29 DIAGNOSIS — S0003XA Contusion of scalp, initial encounter: Secondary | ICD-10-CM | POA: Diagnosis not present

## 2022-10-02 ENCOUNTER — Encounter: Payer: Self-pay | Admitting: Sports Medicine

## 2022-10-04 ENCOUNTER — Ambulatory Visit (INDEPENDENT_AMBULATORY_CARE_PROVIDER_SITE_OTHER): Payer: 59 | Admitting: Sports Medicine

## 2022-10-04 DIAGNOSIS — S20212A Contusion of left front wall of thorax, initial encounter: Secondary | ICD-10-CM

## 2022-10-04 MED ORDER — KETOROLAC TROMETHAMINE 60 MG/2ML IM SOLN
60.0000 mg | Freq: Once | INTRAMUSCULAR | Status: AC
  Administered 2022-10-04: 60 mg via INTRAMUSCULAR

## 2022-10-04 MED ORDER — OXYCODONE-ACETAMINOPHEN 10-325 MG PO TABS: 1.0000 | ORAL_TABLET | Freq: Three times a day (TID) | ORAL | 0 refills | Status: AC | PRN

## 2022-10-04 MED ORDER — CYCLOBENZAPRINE HCL 10 MG PO TABS: ORAL_TABLET | ORAL | 0 refills | Status: AC

## 2022-10-04 NOTE — Addendum Note (Signed)
Addended by: Tarri Glenn A on: 10/04/2022 09:10 AM   Modules accepted: Orders
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
## 2022-10-05 DIAGNOSIS — G8929 Other chronic pain: Secondary | ICD-10-CM | POA: Diagnosis not present

## 2022-10-05 DIAGNOSIS — G894 Chronic pain syndrome: Secondary | ICD-10-CM | POA: Diagnosis not present

## 2022-10-05 DIAGNOSIS — M7918 Myalgia, other site: Secondary | ICD-10-CM | POA: Diagnosis not present

## 2022-10-05 DIAGNOSIS — M25512 Pain in left shoulder: Secondary | ICD-10-CM | POA: Diagnosis not present

## 2022-10-05 DIAGNOSIS — M25511 Pain in right shoulder: Secondary | ICD-10-CM | POA: Diagnosis not present

## 2022-10-05 DIAGNOSIS — S2091XA Abrasion of unspecified parts of thorax, initial encounter: Secondary | ICD-10-CM | POA: Diagnosis not present

## 2022-10-05 DIAGNOSIS — M47816 Spondylosis without myelopathy or radiculopathy, lumbar region: Secondary | ICD-10-CM | POA: Diagnosis not present

## 2022-10-05 DIAGNOSIS — M170 Bilateral primary osteoarthritis of knee: Secondary | ICD-10-CM | POA: Diagnosis not present

## 2022-10-12 NOTE — Addendum Note (Signed)
Addended by: Silverio Decamp on: 10/12/2022 11:16 AM   Modules accepted: Level of Service

## 2022-10-28 ENCOUNTER — Other Ambulatory Visit: Payer: Self-pay | Admitting: Sports Medicine

## 2022-10-28 DIAGNOSIS — J4520 Mild intermittent asthma, uncomplicated: Secondary | ICD-10-CM

## 2022-11-25 ENCOUNTER — Other Ambulatory Visit: Payer: Self-pay | Admitting: Sports Medicine

## 2022-11-25 DIAGNOSIS — I10 Essential (primary) hypertension: Secondary | ICD-10-CM

## 2022-12-02 ENCOUNTER — Ambulatory Visit (INDEPENDENT_AMBULATORY_CARE_PROVIDER_SITE_OTHER): Payer: 59 | Admitting: Sports Medicine

## 2022-12-02 ENCOUNTER — Other Ambulatory Visit (INDEPENDENT_AMBULATORY_CARE_PROVIDER_SITE_OTHER): Payer: 59

## 2022-12-02 DIAGNOSIS — M170 Bilateral primary osteoarthritis of knee: Secondary | ICD-10-CM

## 2022-12-02 MED ORDER — TRIAMCINOLONE ACETONIDE 40 MG/ML IJ SUSP
80.0000 mg | Freq: Once | INTRAMUSCULAR | Status: AC
  Administered 2022-12-02: 80 mg via INTRAMUSCULAR

## 2022-12-02 NOTE — Progress Notes (Signed)
    Procedures performed today:    Procedure: Real-time Ultrasound Guided injection of the left knee Device: Samsung HS60  Verbal informed consent obtained.  Time-out conducted.  Noted no overlying erythema, induration, or other signs of local infection.  Skin prepped in a sterile fashion.  Local anesthesia: Topical Ethyl chloride.  With sterile technique and under real time ultrasound guidance: Moderate effusion noted, 1 cc Kenalog 40, 2 cc lidocaine, 2 cc bupivacaine injected easily Completed without difficulty  Advised to call if fevers/chills, erythema, induration, drainage, or persistent bleeding.  Images permanently stored and available for review in PACS.  Impression: Technically successful ultrasound guided injection.   Procedure: Real-time Ultrasound Guided injection of the right knee Device: Samsung HS60  Verbal informed consent obtained.  Time-out conducted.  Noted no overlying erythema, induration, or other signs of local infection.  Skin prepped in a sterile fashion.  Local anesthesia: Topical Ethyl chloride.  With sterile technique and under real time ultrasound guidance: No effusion noted, 1 cc Kenalog 40, 2 cc lidocaine, 2 cc bupivacaine injected easily Completed without difficulty  Advised to call if fevers/chills, erythema, induration, drainage, or persistent bleeding.  Images permanently stored and available for review in PACS.  Impression: Technically successful ultrasound guided injection.  Independent interpretation of notes and tests performed by another provider:   None.  Brief History, Exam, Impression, and Recommendations:    Primary osteoarthritis of both knees Knee osteoarthritis, bilateral knee injections as above, return as needed.  Morbid obesity ([place]) Jonathan Villarreal plans to call his insurance company and find which GLP-1 is on formulary, I am happy to call in.    ____________________________________________ Gwen Her. Dianah Field, M.D., ABFM.,  CAQSM., AME. Primary Care and Sports Medicine [facility]  Adjunct Professor of [place] of [facility] of Medicine  Risk manager

## 2022-12-02 NOTE — Assessment & Plan Note (Signed)
Jonathan Villarreal plans to call his insurance company and find which GLP-1 is on formulary, I am happy to call in.

## 2022-12-02 NOTE — Assessment & Plan Note (Signed)
Knee osteoarthritis, bilateral knee injections as above, return as needed.

## 2022-12-02 NOTE — Addendum Note (Signed)
Addended by: Tarri Glenn A on: 12/02/2022 10:43 AM   Modules accepted: Orders

## 2022-12-24 ENCOUNTER — Other Ambulatory Visit: Payer: Self-pay | Admitting: Sports Medicine

## 2023-01-04 DIAGNOSIS — Z79891 Long term (current) use of opiate analgesic: Secondary | ICD-10-CM | POA: Diagnosis not present

## 2023-01-04 DIAGNOSIS — G8929 Other chronic pain: Secondary | ICD-10-CM | POA: Diagnosis not present

## 2023-01-04 DIAGNOSIS — M170 Bilateral primary osteoarthritis of knee: Secondary | ICD-10-CM | POA: Diagnosis not present

## 2023-01-04 DIAGNOSIS — M47816 Spondylosis without myelopathy or radiculopathy, lumbar region: Secondary | ICD-10-CM | POA: Diagnosis not present

## 2023-01-04 DIAGNOSIS — G894 Chronic pain syndrome: Secondary | ICD-10-CM | POA: Diagnosis not present

## 2023-01-04 DIAGNOSIS — M25512 Pain in left shoulder: Secondary | ICD-10-CM | POA: Diagnosis not present

## 2023-01-04 DIAGNOSIS — M25511 Pain in right shoulder: Secondary | ICD-10-CM | POA: Diagnosis not present

## 2023-02-10 ENCOUNTER — Ambulatory Visit (INDEPENDENT_AMBULATORY_CARE_PROVIDER_SITE_OTHER): Payer: 59 | Admitting: Sports Medicine

## 2023-02-10 ENCOUNTER — Ambulatory Visit (INDEPENDENT_AMBULATORY_CARE_PROVIDER_SITE_OTHER): Payer: 59

## 2023-02-10 DIAGNOSIS — R062 Wheezing: Secondary | ICD-10-CM

## 2023-02-10 DIAGNOSIS — J4520 Mild intermittent asthma, uncomplicated: Secondary | ICD-10-CM | POA: Diagnosis not present

## 2023-02-10 DIAGNOSIS — R059 Cough, unspecified: Secondary | ICD-10-CM | POA: Diagnosis not present

## 2023-02-10 DIAGNOSIS — M170 Bilateral primary osteoarthritis of knee: Secondary | ICD-10-CM | POA: Diagnosis not present

## 2023-02-10 MED ORDER — HYDROCOD POLI-CHLORPHE POLI ER 10-8 MG/5ML PO SUER: 5.0000 mL | Freq: Two times a day (BID) | ORAL | 0 refills | Status: DC | PRN

## 2023-02-10 MED ORDER — AZITHROMYCIN 250 MG PO TABS: ORAL_TABLET | ORAL | 0 refills | Status: DC

## 2023-02-10 MED ORDER — PREDNISONE 50 MG PO TABS: 50.0000 mg | ORAL_TABLET | Freq: Every day | ORAL | 0 refills | Status: DC

## 2023-02-10 NOTE — Assessment & Plan Note (Addendum)
Jonathan Villarreal has bilateral knee osteoarthritis, has had multiple injections, viscosupplementation, therapy, his BMI is too high for knee replacement. He continues to have discomfort, I would like him to have a consult for genicular artery embolization.

## 2023-02-10 NOTE — Assessment & Plan Note (Addendum)
Jonathan Villarreal returns, he is a pleasant 52 year old male with history of asthma, he has been on Symbicort, unfortunately he and his wife have gotten sick over the past several days with increasing cough, fatigue, headaches. In the exam room today he has a persistent cough, he is able to speak full sentences. Right tympanic membrane is erythematous, he has diffuse inspiratory and expiratory wheezes. Adding prednisone, azithromycin, chest x-ray. Tussionex for cough. Return to see me if not better in a couple weeks.

## 2023-02-10 NOTE — Assessment & Plan Note (Signed)
Has not had success with medications, dietary changes, I would like bariatric surgery to weigh in

## 2023-02-10 NOTE — Progress Notes (Addendum)
    Procedures performed today:    None.  Independent interpretation of notes and tests performed by another provider:   None.  Brief History, Exam, Impression, and Recommendations:    Asthma, mild intermittent Jonathan Villarreal returns, he is a pleasant 52 year old male with history of asthma, he has been on Symbicort, unfortunately he and his wife have gotten sick over the past several days with increasing cough, fatigue, headaches. In the exam room today he has a persistent cough, he is able to speak full sentences. Right tympanic membrane is erythematous, he has diffuse inspiratory and expiratory wheezes. Adding prednisone, azithromycin, chest x-ray. Tussionex for cough. Return to see me if not better in a couple weeks.  Morbid obesity (HCC) Has not had success with medications, dietary changes, I would like bariatric surgery to weigh in  Primary osteoarthritis of both knees Jonathan Villarreal has bilateral knee osteoarthritis, has had multiple injections, viscosupplementation, therapy, his BMI is too high for knee replacement. He continues to have discomfort, I would like him to have a consult for genicular artery embolization.    ____________________________________________ Ihor Austin. Benjamin Stain, M.D., ABFM., CAQSM., AME. Primary Care and Sports Medicine [facility]  Adjunct Professor of Family Medicine  [employer]  Restaurant manager, fast food

## 2023-02-16 ENCOUNTER — Other Ambulatory Visit: Payer: Self-pay | Admitting: Sports Medicine

## 2023-02-16 DIAGNOSIS — I10 Essential (primary) hypertension: Secondary | ICD-10-CM

## 2023-02-16 DIAGNOSIS — J4520 Mild intermittent asthma, uncomplicated: Secondary | ICD-10-CM

## 2023-02-16 DIAGNOSIS — M170 Bilateral primary osteoarthritis of knee: Secondary | ICD-10-CM

## 2023-02-24 ENCOUNTER — Ambulatory Visit: Payer: 59 | Admitting: Sports Medicine

## 2023-03-09 DIAGNOSIS — M9903 Segmental and somatic dysfunction of lumbar region: Secondary | ICD-10-CM | POA: Diagnosis not present

## 2023-03-09 DIAGNOSIS — M9901 Segmental and somatic dysfunction of cervical region: Secondary | ICD-10-CM | POA: Diagnosis not present

## 2023-03-09 DIAGNOSIS — M5416 Radiculopathy, lumbar region: Secondary | ICD-10-CM | POA: Diagnosis not present

## 2023-03-09 DIAGNOSIS — M9902 Segmental and somatic dysfunction of thoracic region: Secondary | ICD-10-CM | POA: Diagnosis not present

## 2023-03-15 DIAGNOSIS — M9902 Segmental and somatic dysfunction of thoracic region: Secondary | ICD-10-CM | POA: Diagnosis not present

## 2023-03-15 DIAGNOSIS — M5416 Radiculopathy, lumbar region: Secondary | ICD-10-CM | POA: Diagnosis not present

## 2023-03-15 DIAGNOSIS — M9903 Segmental and somatic dysfunction of lumbar region: Secondary | ICD-10-CM | POA: Diagnosis not present

## 2023-03-15 DIAGNOSIS — M9901 Segmental and somatic dysfunction of cervical region: Secondary | ICD-10-CM | POA: Diagnosis not present

## 2023-03-29 ENCOUNTER — Other Ambulatory Visit: Payer: Self-pay | Admitting: Sports Medicine

## 2023-03-29 DIAGNOSIS — N529 Male erectile dysfunction, unspecified: Secondary | ICD-10-CM

## 2023-04-05 DIAGNOSIS — M47816 Spondylosis without myelopathy or radiculopathy, lumbar region: Secondary | ICD-10-CM | POA: Diagnosis not present

## 2023-04-05 DIAGNOSIS — G894 Chronic pain syndrome: Secondary | ICD-10-CM | POA: Diagnosis not present

## 2023-04-05 DIAGNOSIS — M170 Bilateral primary osteoarthritis of knee: Secondary | ICD-10-CM | POA: Diagnosis not present

## 2023-04-05 DIAGNOSIS — M25511 Pain in right shoulder: Secondary | ICD-10-CM | POA: Diagnosis not present

## 2023-04-05 DIAGNOSIS — Z79891 Long term (current) use of opiate analgesic: Secondary | ICD-10-CM | POA: Diagnosis not present

## 2023-05-02 ENCOUNTER — Ambulatory Visit (INDEPENDENT_AMBULATORY_CARE_PROVIDER_SITE_OTHER): Payer: 59 | Admitting: Sports Medicine

## 2023-05-02 DIAGNOSIS — Z0000 Encounter for general adult medical examination without abnormal findings: Secondary | ICD-10-CM

## 2023-05-02 NOTE — Patient Instructions (Addendum)
MEDICARE ANNUAL WELLNESS VISIT Health Maintenance Summary and Written Plan of Care  Jonathan Villarreal ,  Thank you for allowing me to perform your Medicare Annual Wellness Visit and for your ongoing commitment to your health.   Health Maintenance & Immunization History Health Maintenance  Topic Date Due   COVID-19 Vaccine (3 - Pfizer risk series) 05/18/2023 (Originally 12/21/2020)   Zoster Vaccines- Shingrix (1 of 2) 08/02/2023 (Originally 02/29/1992)   INFLUENZA VACCINE  12/11/2023 (Originally 04/13/2023)   Colonoscopy  05/01/2024 (Originally 02/28/2018)   Hepatitis C Screening  05/01/2024 (Originally 03/01/1991)   HIV Screening  05/01/2024 (Originally 02/29/1988)   Medicare Annual Wellness (AWV)  05/01/2024   DTaP/Tdap/Td (3 - Td or Tdap) 05/01/2027   HPV VACCINES  Aged Out   Immunization History  Administered Date(s) Administered   Influenza Inj Mdck Quad Pf 05/18/2022   Influenza, High Dose Seasonal PF 05/11/2017, 06/19/2020   Influenza,inj,Quad PF,6+ Mos 05/11/2017, 05/26/2019, 06/19/2020, 08/11/2021   Influenza,inj,Quad PF,6-35 Mos 05/26/2019   Influenza-Unspecified 06/19/2015   MMR 04/21/2016   PFIZER(Purple Top)SARS-COV-2 Vaccination 11/02/2020, 11/23/2020   PPD Test 04/22/2016   Tdap 11/17/2014, 04/30/2017    These are the patient goals that we discussed:  Goals Addressed               This Visit's Progress     Patient Stated (pt-stated)        05/02/2023 AWV Goal: Exercise for General Health  Patient will verbalize understanding of the benefits of increased physical activity: Exercising regularly is important. It will improve your overall fitness, flexibility, and endurance. Regular exercise also will improve your overall health. It can help you control your weight, reduce stress, and improve your bone density. Over the next year, patient will increase physical activity as tolerated with a goal of at least 150 minutes of moderate physical activity per week.  You can tell  that you are exercising at a moderate intensity if your heart starts beating faster and you start breathing faster but can still hold a conversation. Moderate-intensity exercise ideas include: Walking 1 mile (1.6 km) in about 15 minutes Biking Hiking Golfing Dancing Water aerobics Patient will verbalize understanding of everyday activities that increase physical activity by providing examples like the following: Yard work, such as: Insurance underwriter Gardening Washing windows or floors Patient will be able to explain general safety guidelines for exercising:  Before you start a new exercise program, talk with your health care provider. Do not exercise so much that you hurt yourself, feel dizzy, or get very short of breath. Wear comfortable clothes and wear shoes with good support. Drink plenty of water while you exercise to prevent dehydration or heat stroke. Work out until your breathing and your heartbeat get faster.          This is a list of Health Maintenance Items that are overdue or due now: Influenza vaccine Colorectal cancer screening Shingles vaccine  Orders/Referrals Placed Today: No orders of the defined types were placed in this encounter.  (Contact our referral department at [phone] if you have not spoken with someone about your referral appointment within the next 5 days)    Follow-up Plan Follow-up with Monica Becton, MD as planned Schedule influenza and shingles vaccine.  Discuss colonoscopy with PCP.  Medicare wellness visit in one year.  AVS printed and mailed to the patient.       Colonoscopy, Adult A colonoscopy is  a procedure to look at the entire large intestine. This procedure is done using a long, thin, flexible tube that has a camera on the end. You may have a colonoscopy: As a part of normal colorectal screening. If you have certain symptoms,  such as: A low number of red blood cells in your blood (anemia). Diarrhea that does not go away. Pain in your abdomen. Blood in your stool. A colonoscopy can help screen for and diagnose medical problems, including: An abnormal growth of cells or tissue (tumor). Abnormal growths within the lining of your intestine (polyps). Inflammation. Areas of bleeding. Tell your health care provider about: Any allergies you have. All medicines you are taking, including vitamins, herbs, eye drops, creams, and over-the-counter medicines. Any problems you or family members have had with anesthetic medicines. Any bleeding problems you have. Any surgeries you have had. Any medical conditions you have. Any problems you have had with having bowel movements. Whether you are pregnant or may be pregnant. What are the risks? Generally, this is a safe procedure. However, problems may occur, including: Bleeding. Damage to your intestine. Allergic reactions to medicines given during the procedure. Infection. This is rare. What happens before the procedure? Eating and drinking restrictions Follow instructions from your health care provider about eating or drinking restrictions, which may include: A few days before the procedure: Follow a low-fiber diet. Avoid nuts, seeds, dried fruit, raw fruits, and vegetables. 1-3 days before the procedure: Eat only gelatin dessert or ice pops. Drink only clear liquids, such as water, clear juice, clear broth or bouillon, black coffee or tea, or clear soft drinks or sports drinks. Avoid liquids that contain red or purple dye. The day of the procedure: Do not eat solid foods. You may continue to drink clear liquids until up to 2 hours before the procedure. Do not eat or drink anything starting 2 hours before the procedure, or within the time period that your health care provider recommends. Bowel prep If you were prescribed a bowel prep to take by mouth (orally) to clean  out your colon: Take it as told by your health care provider. Starting the day before your procedure, you will need to drink a large amount of liquid medicine. The liquid will cause you to have many bowel movements of loose stool until your stool becomes almost clear or light green. If your skin or the opening between the buttocks (anus) gets irritated from diarrhea, you may relieve the irritation using: Wipes with medicine in them, such as adult wet wipes with aloe and vitamin E. A product to soothe skin, such as petroleum jelly. If you vomit while drinking the bowel prep: Take a break for up to 60 minutes. Begin the bowel prep again. Call your health care provider if you keep vomiting or you cannot take the bowel prep without vomiting. To clean out your colon, you may also be given: Laxative medicines. These help you have a bowel movement. Instructions for enema use. An enema is liquid medicine injected into your rectum. Medicines Ask your health care provider about: Changing or stopping your regular medicines or supplements. This is especially important if you are taking iron supplements, diabetes medicines, or blood thinners. Taking medicines such as aspirin and ibuprofen. These medicines can thin your blood. Do not take these medicines unless your health care provider tells you to take them. Taking over-the-counter medicines, vitamins, herbs, and supplements. General instructions Ask your health care provider what steps will be taken to help prevent  infection. These may include washing skin with a germ-killing soap. If you will be going home right after the procedure, plan to have a responsible adult: Take you home from the hospital or clinic. You will not be allowed to drive. Care for you for the time you are told. What happens during the procedure?  An IV will be inserted into one of your veins. You will be given a medicine to make you fall asleep (general anesthetic). You will lie  on your side with your knees bent. A lubricant will be put on the tube. Then the tube will be: Inserted into your anus. Gently eased through all parts of your large intestine. Air will be sent into your colon to keep it open. This may cause some pressure or cramping. Images will be taken with the camera and will appear on a screen. A small tissue sample may be removed to be looked at under a microscope (biopsy). The tissue may be sent to a lab for testing if any signs of problems are found. If small polyps are found, they may be removed and checked for cancer cells. When the procedure is finished, the tube will be removed. The procedure may vary among health care providers and hospitals. What happens after the procedure? Your blood pressure, heart rate, breathing rate, and blood oxygen level will be monitored until you leave the hospital or clinic. You may have a small amount of blood in your stool. You may pass gas and have mild cramping or bloating in your abdomen. This is caused by the air that was used to open your colon during the exam. If you were given a sedative during the procedure, it can affect you for several hours. Do not drive or operate machinery until your health care provider says that it is safe. It is up to you to get the results of your procedure. Ask your health care provider, or the department that is doing the procedure, when your results will be ready. Summary A colonoscopy is a procedure to look at the entire large intestine. Follow instructions from your health care provider about eating and drinking before the procedure. If you were prescribed an oral bowel prep to clean out your colon, take it as told by your health care provider. During the colonoscopy, a flexible tube with a camera on its end is inserted into the anus and then passed into all parts of the large intestine. This information is not intended to replace advice given to you by your health care provider.  Make sure you discuss any questions you have with your health care provider. Document Revised: 10/11/2022 Document Reviewed: 04/21/2021 Elsevier Patient Education  2024 [employer]  Health Maintenance, Male Adopting a healthy lifestyle and getting preventive care are important in promoting health and wellness. Ask your health care provider about: The right schedule for you to have regular tests and exams. Things you can do on your own to prevent diseases and keep yourself healthy. What should I know about diet, weight, and exercise? Eat a healthy diet  Eat a diet that includes plenty of vegetables, fruits, low-fat dairy products, and lean protein. Do not eat a lot of foods that are high in solid fats, added sugars, or sodium. Maintain a healthy weight Body mass index (BMI) is a measurement that can be used to identify possible weight problems. It estimates body fat based on height and weight. Your health care provider can help determine your BMI and help you achieve or  maintain a healthy weight. Get regular exercise Get regular exercise. This is one of the most important things you can do for your health. Most adults should: Exercise for at least 150 minutes each week. The exercise should increase your heart rate and make you sweat (moderate-intensity exercise). Do strengthening exercises at least twice a week. This is in addition to the moderate-intensity exercise. Spend less time sitting. Even light physical activity can be beneficial. Watch cholesterol and blood lipids Have your blood tested for lipids and cholesterol at 50 years of age, then have this test every 5 years. You may need to have your cholesterol levels checked more often if: Your lipid or cholesterol levels are high. You are older than 50 years of age. You are at high risk for heart disease. What should I know about cancer screening? Many types of cancers can be detected early and may often be prevented. Depending on  your health history and family history, you may need to have cancer screening at various ages. This may include screening for: Colorectal cancer. Prostate cancer. Skin cancer. Lung cancer. What should I know about heart disease, diabetes, and high blood pressure? Blood pressure and heart disease High blood pressure causes heart disease and increases the risk of stroke. This is more likely to develop in people who have high blood pressure readings or are overweight. Talk with your health care provider about your target blood pressure readings. Have your blood pressure checked: Every 3-5 years if you are 59-51 years of age. Every year if you are 8 years old or older. If you are between the ages of 23 and 70 and are a current or former smoker, ask your health care provider if you should have a one-time screening for abdominal aortic aneurysm (AAA). Diabetes Have regular diabetes screenings. This checks your fasting blood sugar level. Have the screening done: Once every three years after age 24 if you are at a normal weight and have a low risk for diabetes. More often and at a younger age if you are overweight or have a high risk for diabetes. What should I know about preventing infection? Hepatitis B If you have a higher risk for hepatitis B, you should be screened for this virus. Talk with your health care provider to find out if you are at risk for hepatitis B infection. Hepatitis C Blood testing is recommended for: Everyone born from [birth year] through 1963. Anyone with known risk factors for hepatitis C. Sexually transmitted infections (STIs) You should be screened each year for STIs, including gonorrhea and chlamydia, if: You are sexually active and are younger than 50 years of age. You are older than 50 years of age and your health care provider tells you that you are at risk for this type of infection. Your sexual activity has changed since you were last screened, and you are at increased  risk for chlamydia or gonorrhea. Ask your health care provider if you are at risk. Ask your health care provider about whether you are at high risk for HIV. Your health care provider may recommend a prescription medicine to help prevent HIV infection. If you choose to take medicine to prevent HIV, you should first get tested for HIV. You should then be tested every 3 months for as long as you are taking the medicine. Follow these instructions at home: Alcohol use Do not drink alcohol if your health care provider tells you not to drink. If you drink alcohol: Limit how much you have to  0-2 drinks a day. Know how much alcohol is in your drink. In the [place] one drink equals one 12 oz bottle of beer (355 mL), one 5 oz glass of wine (148 mL), or one 1 oz glass of hard liquor (44 mL). Lifestyle Do not use any products that contain nicotine or tobacco. These products include cigarettes, chewing tobacco, and vaping devices, such as e-cigarettes. If you need help quitting, ask your health care provider. Do not use street drugs. Do not share needles. Ask your health care provider for help if you need support or information about quitting drugs. General instructions Schedule regular health, dental, and eye exams. Stay current with your vaccines. Tell your health care provider if: You often feel depressed. You have ever been abused or do not feel safe at home. Summary Adopting a healthy lifestyle and getting preventive care are important in promoting health and wellness. Follow your health care provider's instructions about healthy diet, exercising, and getting tested or screened for diseases. Follow your health care provider's instructions on monitoring your cholesterol and blood pressure. This information is not intended to replace advice given to you by your health care provider. Make sure you discuss any questions you have with your health care provider. Document Revised: 01/18/2021 Document  Reviewed: 01/18/2021 Elsevier Patient Education  2024 [employer].

## 2023-05-02 NOTE — Progress Notes (Signed)
MEDICARE ANNUAL WELLNESS VISIT  05/02/2023  Telephone Visit Disclaimer This Medicare AWV was conducted by telephone due to national recommendations for restrictions regarding the COVID-19 Pandemic (e.g. social distancing).  I verified, using two identifiers, that I am speaking with Jonathan Villarreal or their authorized healthcare agent. I discussed the limitations, risks, security, and privacy concerns of performing an evaluation and management service by telephone and the potential availability of an in-person appointment in the future. The patient expressed understanding and agreed to proceed.  Location of Patient: Home Location of Provider (nurse):  In the office.  Subjective:    Jonathan Villarreal is a 52 y.o. male patient of Thekkekandam, Ihor Austin, MD who had a Medicare Annual Wellness Visit today via telephone. Jonathan Villarreal is Disabled and lives alone. he has 2 children. he reports that he is socially active and does interact with friends/family regularly. he is minimally physically active and enjoys fishing but hasn't been able to do it due to knee pain.  Patient Care Team: Monica Becton, MD as PCP - General (Family Medicine)     05/02/2023   11:08 AM  Advanced Directives  Does Patient Have a Medical Advance Directive? No  Would patient like information on creating a medical advance directive? No - Patient declined    Hospital Utilization Over the Past 12 Months: # of hospitalizations or ER visits: 0 # of surgeries: 0  Review of Systems    Patient reports that his overall health is worse compared to last year.  History obtained from chart review and the patient  Patient Reported Readings (BP, Pulse, CBG, Weight, etc)  Per patient no change in vitals since last visit, unable to obtain new vitals due to telehealth visit  Pain Assessment Pain : 0-10 Pain Score: 4  Pain Type: Chronic pain Pain Location: Generalized (both knees, shoulders and back) Pain Descriptors / Indicators:  Aching Pain Onset: More than a month ago Pain Frequency: Constant Pain Relieving Factors: medication  Pain Relieving Factors: medication  Current Medications & Allergies (verified) Allergies as of 05/02/2023       Reactions   Bee Venom Anaphylaxis   Dust Mite Extract         Medication List        Accurate as of May 02, 2023 11:25 AM. If you have any questions, ask your nurse or doctor.          STOP taking these medications    azithromycin 250 MG tablet Commonly known as: Zithromax Z-Pak   chlorpheniramine-HYDROcodone 10-8 MG/5ML Commonly known as: TUSSIONEX   predniSONE 50 MG tablet Commonly known as: DELTASONE       TAKE these medications    AMBULATORY NON FORMULARY MEDICATION Incentive spirometer to be used q2-3h.   amLODipine 5 MG tablet Commonly known as: NORVASC TAKE 1 TABLET (5 MG TOTAL) BY MOUTH DAILY.   atorvastatin 10 MG tablet Commonly known as: LIPITOR Take 1 tablet (10 mg total) by mouth daily at 6 PM.   buPROPion 150 MG 24 hr tablet Commonly known as: WELLBUTRIN XL 1 TABLET DAILY FOR A MONTH THEN 1 TAB TWICE A DAY   cyclobenzaprine 10 MG tablet Commonly known as: FLEXERIL One half to one tab PO qHS, then increase gradually to one tab TID.   DULoxetine 20 MG capsule Commonly known as: CYMBALTA Take by mouth.   ibuprofen 800 MG tablet Commonly known as: ADVIL TAKE 1 TABLET BY MOUTH EVERY 8 HOURS AS NEEDED   indomethacin 50 MG  capsule Commonly known as: INDOCIN TAKE 1 CAPSULE (50 MG TOTAL) BY MOUTH 2 (TWO) TIMES DAILY WITH A MEAL.   lisinopril-hydrochlorothiazide 20-25 MG tablet Commonly known as: ZESTORETIC TAKE 1 TABLET BY MOUTH EVERY DAY   naloxone 4 MG/0.1ML Liqd nasal spray kit Commonly known as: NARCAN as directed.   Nebulizer Air Tube/Plugs Misc Use as needed, tubing, mask   oxyCODONE-acetaminophen 10-325 MG tablet Commonly known as: Percocet Take 1 tablet by mouth every 8 (eight) hours as needed for pain.    ProAir RespiClick 108 (90 Base) MCG/ACT Aepb Generic drug: Albuterol Sulfate Inhale 2 puffs into the lungs every 6 (six) hours as needed (cough, wheeze, SOB).   albuterol (2.5 MG/3ML) 0.083% nebulizer solution Commonly known as: PROVENTIL TAKE 3 MLS (2.5 MG TOTAL) BY NEBULIZATION EVERY 4 (FOUR) HOURS AS NEEDED FOR WHEEZING OR SHORTNESS OF BREATH (PLEASE INCLUDE NEBULIZER MACHINE, HOSES, AND MASK IF NEEDED.).   sertraline 50 MG tablet Commonly known as: ZOLOFT Take 1.5 tablets (75 mg total) by mouth daily.   sildenafil 50 MG tablet Commonly known as: VIAGRA TAKE 1 TABLET BY MOUTH DAILY AS NEEDED  FOR ERECTILE DYSFUNCTION   Symbicort 160-4.5 MCG/ACT inhaler Generic drug: budesonide-formoterol INHALE 1 PUFF INTO THE LUNGS TWICE A DAY   traMADol 100 MG 24 hr tablet Commonly known as: ULTRAM-ER Take by mouth.        History (reviewed): Past Medical History:  Diagnosis Date   Asthma    Past Surgical History:  Procedure Laterality Date   APPENDECTOMY     Family History  Problem Relation Age of Onset   Diabetes Mother    Hypertension Father    Social History   Socioeconomic History   Marital status: Legally Separated    Spouse name: Not on file   Number of children: 2   Years of education: 7   Highest education level: 7th grade  Occupational History   Occupation: disabled  Tobacco Use   Smoking status: Former    Current packs/day: 0.50    Types: Cigarettes   Smokeless tobacco: Never  Vaping Use   Vaping status: Never Used  Substance and Sexual Activity   Alcohol use: Yes    Alcohol/week: 0.0 standard drinks of alcohol   Drug use: No   Sexual activity: Not on file  Other Topics Concern   Not on file  Social History Narrative   Lives alone. He has two kids. His daughter visits often to help. He enjoys fishing but hasn't been able to do due to knee pain.   Social Determinants of Health   Financial Resource Strain: Low Risk  (05/02/2023)   Overall  Financial Resource Strain (CARDIA)    Difficulty of Paying Living Expenses: Not hard at all  Food Insecurity: No Food Insecurity (05/02/2023)   Hunger Vital Sign    Worried About Running Out of Food in the Last Year: Never true    Ran Out of Food in the Last Year: Never true  Recent Concern: Food Insecurity - Food Insecurity Present (04/04/2023)   Received from [facility]   Hunger Vital Sign    Worried About Running Out of Food in the Last Year: Sometimes true    Ran Out of Food in the Last Year: Patient declined  Transportation Needs: No Transportation Needs (05/02/2023)   PRAPARE - Administrator, Civil Service (Medical): No    Lack of Transportation (Non-Medical): No  Physical Activity: Insufficiently Active (05/02/2023)   Exercise Vital Sign  Days of Exercise per Week: 3 days    Minutes of Exercise per Session: 20 min  Stress: No Stress Concern Present (05/02/2023)   [employer] of Occupational Health - Occupational Stress Questionnaire    Feeling of Stress : Not at all  Recent Concern: Stress - Stress Concern Present (02/10/2023)   [employer] of Occupational Health - Occupational Stress Questionnaire    Feeling of Stress : To some extent  Social Connections: Socially Isolated (05/02/2023)   Social Connection and Isolation Panel [NHANES]    Frequency of Communication with Friends and Family: More than three times a week    Frequency of Social Gatherings with Friends and Family: More than three times a week    Attends Religious Services: Never    Database administrator or Organizations: No    Attends Banker Meetings: Never    Marital Status: Separated    Activities of Daily Living    05/02/2023   11:13 AM  In your present state of health, do you have any difficulty performing the following activities:  Hearing? 1  Comment some hearing loss  Vision? 0  Difficulty concentrating or making decisions? 1  Comment some forgetfullness   Walking or climbing stairs? 1  Dressing or bathing? 1  Comment with help  Doing errands, shopping? 1  Comment usually goes with someone  Preparing Food and eating ? N  Using the Toilet? N  In the past six months, have you accidently leaked urine? N  Do you have problems with loss of bowel control? N  Managing your Medications? Y  Comment his daughter and his ex-wife helps with setting the pill box  Managing your Finances? Y  Comment his daughter helps with that  Housekeeping or managing your Housekeeping? Y  Comment his daughter or ex wife helps with that    Patient Education/ Literacy How often do you need to have someone help you when you read instructions, pamphlets, or other written materials from your doctor or pharmacy?: 5 - Always What is the last grade level you completed in school?: 6th grade  Exercise    Diet Patient reports consuming 4-5 small meals a day and 0 snack(s) a day Patient reports that his primary diet is: Regular Patient reports that she does have regular access to food.   Depression Screen    05/02/2023   11:09 AM 05/06/2021   10:01 AM 02/02/2021    8:42 AM 12/03/2020   12:20 PM 03/19/2020   10:02 AM 01/18/2018    3:28 PM 05/11/2017    3:12 PM  PHQ 2/9 Scores  PHQ - 2 Score 1 6 3 5  0 0 0  PHQ- 9 Score  21 10 20         Fall Risk    05/02/2023   11:09 AM  Fall Risk   Falls in the past year? 1  Number falls in past yr: 0  Injury with Fall? 0  Risk for fall due to : Impaired mobility;History of fall(s)  Follow up Falls evaluation completed;Education provided;Falls prevention discussed     Objective:  Jonathan Villarreal seemed alert and oriented and he participated appropriately during our telephone visit.  Blood Pressure Weight BMI  BP Readings from Last 3 Encounters:  05/31/22 127/80  02/15/22 107/69  01/12/22 109/69   Wt Readings from Last 3 Encounters:  05/31/22 (!) 312 lb (141.5 kg)  02/15/22 (!) 308 lb (139.7 kg)  01/12/22 (!) 306 lb (138.8  kg)  BMI Readings from Last 1 Encounters:  05/31/22 50.36 kg/m    *Unable to obtain current vital signs, weight, and BMI due to telephone visit type  Hearing/Vision  Hassen did not seem to have difficulty with hearing/understanding during the telephone conversation Reports that he has had a formal eye exam by an eye care professional within the past year Reports that he has not had a formal hearing evaluation within the past year *Unable to fully assess hearing and vision during telephone visit type  Cognitive Function:    05/02/2023   11:16 AM  6CIT Screen  What Year? 0 points  What month? 0 points  What time? 0 points  Count back from 20 0 points  Months in reverse 4 points  Repeat phrase 4 points  Total Score 8 points   (Normal:0-7, Significant for Dysfunction: >8)  Normal Cognitive Function Screening: No: Patient reported some memory loss.   Immunization & Health Maintenance Record Immunization History  Administered Date(s) Administered   Influenza Inj Mdck Quad Pf 05/18/2022   Influenza, High Dose Seasonal PF 05/11/2017, 06/19/2020   Influenza,inj,Quad PF,6+ Mos 05/11/2017, 05/26/2019, 06/19/2020, 08/11/2021   Influenza,inj,Quad PF,6-35 Mos 05/26/2019   Influenza-Unspecified 06/19/2015   MMR 04/21/2016   PFIZER(Purple Top)SARS-COV-2 Vaccination 11/02/2020, 11/23/2020   PPD Test 04/22/2016   Tdap 11/17/2014, 04/30/2017    Health Maintenance  Topic Date Due   COVID-19 Vaccine (3 - Pfizer risk series) 05/18/2023 (Originally 12/21/2020)   Zoster Vaccines- Shingrix (1 of 2) 08/02/2023 (Originally 02/29/1992)   INFLUENZA VACCINE  12/11/2023 (Originally 04/13/2023)   Colonoscopy  05/01/2024 (Originally 02/28/2018)   Hepatitis C Screening  05/01/2024 (Originally 03/01/1991)   HIV Screening  05/01/2024 (Originally 02/29/1988)   Medicare Annual Wellness (AWV)  05/01/2024   DTaP/Tdap/Td (3 - Td or Tdap) 05/01/2027   HPV VACCINES  Aged Out       Assessment  This is a  routine wellness examination for Jonathan Villarreal.  Health Maintenance: Due or Overdue There are no preventive care reminders to display for this patient.   Jonathan Villarreal does not need a referral for [employer] Assistance: Care Management:   no Social Work:    no Prescription Assistance:  no Nutrition/Diabetes Education:  no   Plan:  Personalized Goals  Goals Addressed               This Visit's Progress     Patient Stated (pt-stated)        05/02/2023 AWV Goal: Exercise for General Health  Patient will verbalize understanding of the benefits of increased physical activity: Exercising regularly is important. It will improve your overall fitness, flexibility, and endurance. Regular exercise also will improve your overall health. It can help you control your weight, reduce stress, and improve your bone density. Over the next year, patient will increase physical activity as tolerated with a goal of at least 150 minutes of moderate physical activity per week.  You can tell that you are exercising at a moderate intensity if your heart starts beating faster and you start breathing faster but can still hold a conversation. Moderate-intensity exercise ideas include: Walking 1 mile (1.6 km) in about 15 minutes Biking Hiking Golfing Dancing Water aerobics Patient will verbalize understanding of everyday activities that increase physical activity by providing examples like the following: Yard work, such as: Insurance underwriter Gardening Washing windows or floors Patient will be able to explain general safety guidelines for exercising:  Before you start a new exercise program, talk with your health care provider. Do not exercise so much that you hurt yourself, feel dizzy, or get very short of breath. Wear comfortable clothes and wear shoes with good support. Drink plenty of water while you exercise to prevent  dehydration or heat stroke. Work out until your breathing and your heartbeat get faster.        Personalized Health Maintenance & Screening Recommendations  Influenza vaccine Colorectal cancer screening Shingles vaccine  Lung Cancer Screening Recommended: no (Low Dose CT Chest recommended if Age 66-80 years, 20 pack-year currently smoking OR have quit w/in past 15 years) Hepatitis C Screening recommended: no HIV Screening recommended: no  Advanced Directives: Written information was not prepared per patient's request.  Referrals & Orders No orders of the defined types were placed in this encounter.   Follow-up Plan Follow-up with Monica Becton, MD as planned Schedule influenza and shingles vaccine.  Discuss colonoscopy with PCP.  Medicare wellness visit in one year.  AVS printed and mailed to the patient.    I have personally reviewed and noted the following in the patient's chart:   Medical and social history Use of alcohol, tobacco or illicit drugs  Current medications and supplements Functional ability and status Nutritional status Physical activity Advanced directives List of other physicians Hospitalizations, surgeries, and ER visits in previous 12 months Vitals Screenings to include cognitive, depression, and falls Referrals and appointments  In addition, I have reviewed and discussed with Jonathan Villarreal certain preventive protocols, quality metrics, and best practice recommendations. A written personalized care plan for preventive services as well as general preventive health recommendations is available and can be mailed to the patient at his request.      Modesto Charon, RN BSN  05/02/2023

## 2023-05-06 ENCOUNTER — Other Ambulatory Visit: Payer: Self-pay | Admitting: Sports Medicine

## 2023-06-16 ENCOUNTER — Ambulatory Visit (INDEPENDENT_AMBULATORY_CARE_PROVIDER_SITE_OTHER): Payer: 59 | Admitting: Sports Medicine

## 2023-06-16 ENCOUNTER — Encounter: Payer: Self-pay | Admitting: Sports Medicine

## 2023-06-16 ENCOUNTER — Other Ambulatory Visit (INDEPENDENT_AMBULATORY_CARE_PROVIDER_SITE_OTHER): Payer: 59

## 2023-06-16 DIAGNOSIS — M170 Bilateral primary osteoarthritis of knee: Secondary | ICD-10-CM

## 2023-06-16 MED ORDER — ZEPBOUND 2.5 MG/0.5ML ~~LOC~~ SOAJ
2.5000 mg | SUBCUTANEOUS | 0 refills | Status: DC
Start: 2023-06-16 — End: 2023-12-05

## 2023-06-16 MED ORDER — TRIAMCINOLONE ACETONIDE 40 MG/ML IJ SUSP
80.0000 mg | Freq: Once | INTRAMUSCULAR | Status: AC
Start: 2023-06-16 — End: 2023-06-16
  Administered 2023-06-16: 80 mg via INTRAMUSCULAR

## 2023-06-16 NOTE — Addendum Note (Signed)
Addended by: Carren Rang A on: 06/16/2023 01:18 PM   Modules accepted: Orders

## 2023-06-16 NOTE — Assessment & Plan Note (Signed)
Historically did well with GLP-1's, discount program ended, I would like to get him approved again for GLP-1, we will start with Zepbound and if unable to get this approved we can try [facility]. For insurance coverage purposes the patient has a high BMI, multiple comorbidities, he has tried diet, exercise without sufficient weight loss, he will be enrolled in a multidisciplinary weight loss program with calorie counting and an exercise prescription.

## 2023-06-16 NOTE — Assessment & Plan Note (Addendum)
Bilateral knee osteoarthritis, has had multiple injections, Visco, therapy, BMI too high for knee replacement, we did refer him for geniculate artery embolization but he did not follow through, we will place the referral again. Repeat bilateral injections today.

## 2023-06-16 NOTE — Progress Notes (Signed)
    Procedures performed today:    Procedure: Real-time Ultrasound Guided injection of the left knee Device: Samsung HS60  Verbal informed consent obtained.  Time-out conducted.  Noted no overlying erythema, induration, or other signs of local infection.  Skin prepped in a sterile fashion.  Local anesthesia: Topical Ethyl chloride.  With sterile technique and under real time ultrasound guidance: Moderate effusion noted, 1 cc Kenalog 40, 2 cc lidocaine, 2 cc bupivacaine injected easily Completed without difficulty  Advised to call if fevers/chills, erythema, induration, drainage, or persistent bleeding.  Images permanently stored and available for review in PACS.  Impression: Technically successful ultrasound guided injection.   Procedure: Real-time Ultrasound Guided injection of the right knee Device: Samsung HS60  Verbal informed consent obtained.  Time-out conducted.  Noted no overlying erythema, induration, or other signs of local infection.  Skin prepped in a sterile fashion.  Local anesthesia: Topical Ethyl chloride.  With sterile technique and under real time ultrasound guidance: No effusion noted, 1 cc Kenalog 40, 2 cc lidocaine, 2 cc bupivacaine injected easily Completed without difficulty  Advised to call if fevers/chills, erythema, induration, drainage, or persistent bleeding.  Images permanently stored and available for review in PACS.  Impression: Technically successful ultrasound guided injection.  Independent interpretation of notes and tests performed by another provider:   None.  Brief History, Exam, Impression, and Recommendations:    Primary osteoarthritis of both knees Bilateral knee osteoarthritis, has had multiple injections, Visco, therapy, BMI too high for knee replacement, we did refer him for geniculate artery embolization but he did not follow through, we will place the referral again. Repeat bilateral injections today.  Morbid obesity  (HCC) Historically did well with GLP-1's, discount program ended, I would like to get him approved again for GLP-1, we will start with Zepbound and if unable to get this approved we can try [facility]. For insurance coverage purposes the patient has a high BMI, multiple comorbidities, he has tried diet, exercise without sufficient weight loss, he will be enrolled in a multidisciplinary weight loss program with calorie counting and an exercise prescription.    ____________________________________________ Ihor Austin. Benjamin Stain, M.D., ABFM., CAQSM., AME. Primary Care and Sports Medicine [facility]  Adjunct Professor of Family Medicine  [place] [employer] [facility] [employer]  Restaurant manager, fast food

## 2023-07-06 DIAGNOSIS — G8929 Other chronic pain: Secondary | ICD-10-CM | POA: Diagnosis not present

## 2023-07-06 DIAGNOSIS — M47816 Spondylosis without myelopathy or radiculopathy, lumbar region: Secondary | ICD-10-CM | POA: Diagnosis not present

## 2023-07-06 DIAGNOSIS — M170 Bilateral primary osteoarthritis of knee: Secondary | ICD-10-CM | POA: Diagnosis not present

## 2023-07-06 DIAGNOSIS — M25511 Pain in right shoulder: Secondary | ICD-10-CM | POA: Diagnosis not present

## 2023-07-06 DIAGNOSIS — Z79891 Long term (current) use of opiate analgesic: Secondary | ICD-10-CM | POA: Diagnosis not present

## 2023-08-29 ENCOUNTER — Other Ambulatory Visit: Payer: Self-pay | Admitting: Sports Medicine

## 2023-08-29 DIAGNOSIS — N529 Male erectile dysfunction, unspecified: Secondary | ICD-10-CM

## 2023-09-11 ENCOUNTER — Other Ambulatory Visit: Payer: Self-pay | Admitting: Sports Medicine

## 2023-09-11 DIAGNOSIS — I10 Essential (primary) hypertension: Secondary | ICD-10-CM

## 2023-10-06 DIAGNOSIS — M170 Bilateral primary osteoarthritis of knee: Secondary | ICD-10-CM | POA: Diagnosis not present

## 2023-10-06 DIAGNOSIS — M47816 Spondylosis without myelopathy or radiculopathy, lumbar region: Secondary | ICD-10-CM | POA: Diagnosis not present

## 2023-10-06 DIAGNOSIS — M25511 Pain in right shoulder: Secondary | ICD-10-CM | POA: Diagnosis not present

## 2023-10-06 DIAGNOSIS — Z5181 Encounter for therapeutic drug level monitoring: Secondary | ICD-10-CM | POA: Diagnosis not present

## 2023-10-06 DIAGNOSIS — G894 Chronic pain syndrome: Secondary | ICD-10-CM | POA: Diagnosis not present

## 2023-10-06 DIAGNOSIS — Z79891 Long term (current) use of opiate analgesic: Secondary | ICD-10-CM | POA: Diagnosis not present

## 2023-10-16 ENCOUNTER — Ambulatory Visit: Payer: 59

## 2023-10-16 ENCOUNTER — Ambulatory Visit (INDEPENDENT_AMBULATORY_CARE_PROVIDER_SITE_OTHER): Payer: 59 | Admitting: Sports Medicine

## 2023-10-16 VITALS — BP 137/82 | HR 89 | Temp 98.1°F

## 2023-10-16 DIAGNOSIS — R509 Fever, unspecified: Secondary | ICD-10-CM

## 2023-10-16 DIAGNOSIS — J111 Influenza due to unidentified influenza virus with other respiratory manifestations: Secondary | ICD-10-CM | POA: Insufficient documentation

## 2023-10-16 DIAGNOSIS — R059 Cough, unspecified: Secondary | ICD-10-CM | POA: Diagnosis not present

## 2023-10-16 DIAGNOSIS — J45909 Unspecified asthma, uncomplicated: Secondary | ICD-10-CM

## 2023-10-16 DIAGNOSIS — R5381 Other malaise: Secondary | ICD-10-CM

## 2023-10-16 LAB — POCT INFLUENZA A/B
Influenza A, POC: NEGATIVE
Influenza B, POC: NEGATIVE

## 2023-10-16 LAB — POC COVID19 BINAXNOW: SARS Coronavirus 2 Ag: NEGATIVE

## 2023-10-16 MED ORDER — OSELTAMIVIR PHOSPHATE 75 MG PO CAPS
75.0000 mg | ORAL_CAPSULE | Freq: Two times a day (BID) | ORAL | 0 refills | Status: DC
Start: 2023-10-16 — End: 2024-05-09

## 2023-10-16 MED ORDER — PREDNISONE 50 MG PO TABS: ORAL_TABLET | ORAL | 0 refills | Status: DC

## 2023-10-16 MED ORDER — HYDROCOD POLI-CHLORPHE POLI ER 10-8 MG/5ML PO SUER
5.0000 mL | Freq: Two times a day (BID) | ORAL | 0 refills | Status: DC | PRN
Start: 2023-10-16 — End: 2024-05-09

## 2023-10-16 MED ORDER — DOXYCYCLINE HYCLATE 100 MG PO TABS
100.0000 mg | ORAL_TABLET | Freq: Two times a day (BID) | ORAL | 0 refills | Status: AC
Start: 2023-10-16 — End: 2023-10-23

## 2023-10-16 NOTE — Progress Notes (Signed)
    Procedures performed today:    None.  Independent interpretation of notes and tests performed by another provider:   None.  Brief History, Exam, Impression, and Recommendations:    Influenza-like illness This is a 53 year old male with a history of obesity and asthma, he has had starting on 3 days of cough, muscle aches, body aches, fevers and chills, fatigue and malaise. 2 household contacts have already been diagnosed with influenza. On exam he does appear ill, he is speaking full sentences but coughing frequently. Head and neck exam is normal. He does have some coarse lung sounds right lower lobe. Otherwise no accessory muscle use or signs of respiratory distress. Dace is high risk. COVID and flu tests today were negative but I do suspect a false negative flu. We will treat him aggressively with Tamiflu, doxycycline, prednisone, Tussionex, chest x-ray.  I spent 30 minutes of total time managing this patient today, this includes chart review, face to face, and non-face to face time.  ____________________________________________ Ihor Austin. Benjamin Stain, M.D., ABFM., CAQSM., AME. Primary Care and Sports Medicine [facility]  Adjunct Professor of Family Medicine  [place] [employer] [facility] [employer]  Restaurant manager, fast food

## 2023-10-16 NOTE — Addendum Note (Signed)
Addended by: Carren Rang A on: 10/16/2023 02:15 PM   Modules accepted: Orders

## 2023-10-16 NOTE — Assessment & Plan Note (Addendum)
This is a 53 year old male with a history of obesity and asthma, he has had starting on 3 days of cough, muscle aches, body aches, fevers and chills, fatigue and malaise. 2 household contacts have already been diagnosed with influenza. On exam he does appear ill, he is speaking full sentences but coughing frequently. Head and neck exam is normal. He does have some coarse lung sounds right lower lobe. Otherwise no accessory muscle use or signs of respiratory distress. Claud is high risk. COVID and flu tests today were negative but I do suspect a false negative flu. We will treat him aggressively with Tamiflu, doxycycline, prednisone, Tussionex, chest x-ray.

## 2023-11-20 DIAGNOSIS — N200 Calculus of kidney: Secondary | ICD-10-CM | POA: Diagnosis not present

## 2023-11-20 DIAGNOSIS — R0683 Snoring: Secondary | ICD-10-CM | POA: Diagnosis not present

## 2023-11-20 DIAGNOSIS — I10 Essential (primary) hypertension: Secondary | ICD-10-CM | POA: Diagnosis not present

## 2023-11-20 DIAGNOSIS — E782 Mixed hyperlipidemia: Secondary | ICD-10-CM | POA: Diagnosis not present

## 2023-11-24 DIAGNOSIS — J309 Allergic rhinitis, unspecified: Secondary | ICD-10-CM | POA: Diagnosis not present

## 2023-11-24 DIAGNOSIS — I10 Essential (primary) hypertension: Secondary | ICD-10-CM | POA: Diagnosis not present

## 2023-11-24 DIAGNOSIS — J4540 Moderate persistent asthma, uncomplicated: Secondary | ICD-10-CM | POA: Diagnosis not present

## 2023-11-28 DIAGNOSIS — Z713 Dietary counseling and surveillance: Secondary | ICD-10-CM | POA: Diagnosis not present

## 2023-12-05 ENCOUNTER — Telehealth: Payer: Self-pay | Admitting: Sports Medicine

## 2023-12-05 ENCOUNTER — Encounter: Payer: Self-pay | Admitting: Sports Medicine

## 2023-12-05 ENCOUNTER — Ambulatory Visit (INDEPENDENT_AMBULATORY_CARE_PROVIDER_SITE_OTHER): Admitting: Sports Medicine

## 2023-12-05 ENCOUNTER — Other Ambulatory Visit (INDEPENDENT_AMBULATORY_CARE_PROVIDER_SITE_OTHER): Payer: Self-pay

## 2023-12-05 VITALS — BP 107/70 | HR 77 | Resp 20 | Ht 66.0 in | Wt 331.0 lb

## 2023-12-05 DIAGNOSIS — M170 Bilateral primary osteoarthritis of knee: Secondary | ICD-10-CM | POA: Diagnosis not present

## 2023-12-05 MED ORDER — ZEPBOUND 2.5 MG/0.5ML ~~LOC~~ SOAJ: 2.5000 mg | SUBCUTANEOUS | 0 refills | Status: DC

## 2023-12-05 MED ORDER — TRIAMCINOLONE ACETONIDE 40 MG/ML IJ SUSP
80.0000 mg | Freq: Once | INTRAMUSCULAR | Status: AC
  Administered 2023-12-05: 80 mg via INTRAMUSCULAR

## 2023-12-05 NOTE — Progress Notes (Signed)
    Procedures performed today:    Procedure: Real-time Ultrasound Guided injection of the left knee Device: Samsung HS60  Verbal informed consent obtained.  Time-out conducted.  Noted no overlying erythema, induration, or other signs of local infection.  Skin prepped in a sterile fashion.  Local anesthesia: Topical Ethyl chloride.  With sterile technique and under real time ultrasound guidance: 1 cc Kenalog 40, 2 cc lidocaine, 2 cc bupivacaine injected easily Completed without difficulty  Advised to call if fevers/chills, erythema, induration, drainage, or persistent bleeding.  Images permanently stored and available for review in PACS.  Impression: Technically successful ultrasound guided injection.   Procedure: Real-time Ultrasound Guided injection of the right knee Device: Samsung HS60  Verbal informed consent obtained.  Time-out conducted.  Noted no overlying erythema, induration, or other signs of local infection.  Skin prepped in a sterile fashion.  Local anesthesia: Topical Ethyl chloride.  With sterile technique and under real time ultrasound guidance:  1 cc Kenalog 40, 2 cc lidocaine, 2 cc bupivacaine injected easily Completed without difficulty  Advised to call if fevers/chills, erythema, induration, drainage, or persistent bleeding.  Images permanently stored and available for review in PACS.  Impression: Technically successful ultrasound guided injection.  Independent interpretation of notes and tests performed by another provider:   None.  Brief History, Exam, Impression, and Recommendations:    Primary osteoarthritis of both knees Jonathan Villarreal returns, pleasant 53 year old male, known bilateral knee osteoarthritis, last injection was done about a year ago. BMI too high for knee replacement. We will do bilateral steroid injections today. We will also work on [employer]. We also did a referral for geniculate artery embolization in the past but he did not follow  through. I will see him back to start Visco when approved.  Morbid obesity (HCC) Historically did well with GLP-1's however the disc program ended, for insurance purposes he has a high BMI, multiple comorbidities, he will be enrolled in multidisciplinary weight loss program with calorie counting, and exercise prescription, he has no contraindications to GLP-1 and he will not be using any additional weight loss medications. We will try to get Zepbound again. We did talk about compounded tirzepatide as well should we be unable to get branded Zepbound approved. If he does get approved I would like to see him back after 3 months, we will call in the next 2 doses as well.    ____________________________________________ Ihor Austin. Benjamin Stain, M.D., ABFM., CAQSM., AME. Primary Care and Sports Medicine [place] [facility]  Adjunct Professor of Family Medicine  [place] [employer] [facility] [employer]  Restaurant manager, fast food

## 2023-12-05 NOTE — Telephone Encounter (Signed)
 Bilateral visco approval please

## 2023-12-05 NOTE — Assessment & Plan Note (Addendum)
 Historically did well with GLP-1's however the disc program ended, for insurance purposes he has a high BMI, multiple comorbidities, he will be enrolled in multidisciplinary weight loss program with calorie counting, and exercise prescription, he has no contraindications to GLP-1 and he will not be using any additional weight loss medications. We will try to get Zepbound again. We did talk about compounded tirzepatide as well should we be unable to get branded Zepbound approved. If he does get approved I would like to see him back after 3 months, we will call in the next 2 doses as well.

## 2023-12-05 NOTE — Assessment & Plan Note (Signed)
 Mahlon returns, pleasant 53 year old male, known bilateral knee osteoarthritis, last injection was done about a year ago. BMI too high for knee replacement. We will do bilateral steroid injections today. We will also work on [employer]. We also did a referral for geniculate artery embolization in the past but he did not follow through. I will see him back to start Visco when approved.

## 2023-12-05 NOTE — Addendum Note (Signed)
 Addended by: Samule Dry on: 12/05/2023 12:11 PM   Modules accepted: Orders

## 2023-12-08 DIAGNOSIS — G4733 Obstructive sleep apnea (adult) (pediatric): Secondary | ICD-10-CM | POA: Diagnosis not present

## 2023-12-08 NOTE — Telephone Encounter (Addendum)
 Benefits Investigation Details received from [employer] Injection: Orthovisc/Synvisc PA required: Yes May fill through: Buy and Bill  OV Copay/Coinsurance: 0% Product Copay: 0% Administration Coinsurance: 0% Administration Copay: $ Deductible: $   Benefits investigation started 12/08/2023.  Prior Authorization started through [facility] [employer] 12/14/2022. Case #: [id number] Awaiting Further Details.

## 2023-12-12 ENCOUNTER — Other Ambulatory Visit: Payer: Self-pay | Admitting: Sports Medicine

## 2023-12-12 DIAGNOSIS — J4520 Mild intermittent asthma, uncomplicated: Secondary | ICD-10-CM

## 2023-12-15 ENCOUNTER — Telehealth: Payer: Self-pay | Admitting: Sports Medicine

## 2023-12-15 NOTE — Telephone Encounter (Signed)
 Jonathan Villarreal with [facility] called.  Orthovisc will have to go to MD's for approval. However, [name] statesis pcp would like to switch there is no PA is required for Gelsyn-3, Synvisc or Synvisc 1.  Contact for [name]: [phone]                                  Ext [phone] You can leave message on her secured voicemail.

## 2023-12-15 NOTE — Telephone Encounter (Signed)
 Copied from CRM [id number]. Topic: Clinical - Request for Lab/Test Order >> Dec 15, 2023 10:30 AM Tiffany H wrote: Reason for CRM: Genelle Bal with [employer] called with questions about a Prior Authorization.   Albin Felling is listed as staff on forms.

## 2023-12-21 ENCOUNTER — Other Ambulatory Visit (HOSPITAL_COMMUNITY): Payer: Self-pay

## 2024-01-03 NOTE — Telephone Encounter (Signed)
 Spoke with Jonathan Villarreal at [facility] he stated that Synvisc would not require a PA. Pt would be responsible for a 20% coinsurance, deductible does not apply, OOP max applies ($9350/ met $506.60).

## 2024-01-04 DIAGNOSIS — Z5181 Encounter for therapeutic drug level monitoring: Secondary | ICD-10-CM | POA: Diagnosis not present

## 2024-01-04 DIAGNOSIS — M170 Bilateral primary osteoarthritis of knee: Secondary | ICD-10-CM | POA: Diagnosis not present

## 2024-01-04 DIAGNOSIS — G894 Chronic pain syndrome: Secondary | ICD-10-CM | POA: Diagnosis not present

## 2024-01-04 DIAGNOSIS — M47816 Spondylosis without myelopathy or radiculopathy, lumbar region: Secondary | ICD-10-CM | POA: Diagnosis not present

## 2024-01-04 DIAGNOSIS — Z79891 Long term (current) use of opiate analgesic: Secondary | ICD-10-CM | POA: Diagnosis not present

## 2024-01-17 ENCOUNTER — Other Ambulatory Visit: Payer: Self-pay | Admitting: Sports Medicine

## 2024-01-17 DIAGNOSIS — I10 Essential (primary) hypertension: Secondary | ICD-10-CM

## 2024-02-18 ENCOUNTER — Other Ambulatory Visit: Payer: Self-pay | Admitting: Sports Medicine

## 2024-02-18 DIAGNOSIS — M170 Bilateral primary osteoarthritis of knee: Secondary | ICD-10-CM

## 2024-03-13 ENCOUNTER — Other Ambulatory Visit: Payer: Self-pay | Admitting: Sports Medicine

## 2024-03-13 DIAGNOSIS — N529 Male erectile dysfunction, unspecified: Secondary | ICD-10-CM

## 2024-04-02 DIAGNOSIS — Z5181 Encounter for therapeutic drug level monitoring: Secondary | ICD-10-CM | POA: Diagnosis not present

## 2024-04-02 DIAGNOSIS — G894 Chronic pain syndrome: Secondary | ICD-10-CM | POA: Diagnosis not present

## 2024-04-02 DIAGNOSIS — Z79891 Long term (current) use of opiate analgesic: Secondary | ICD-10-CM | POA: Diagnosis not present

## 2024-04-02 DIAGNOSIS — M170 Bilateral primary osteoarthritis of knee: Secondary | ICD-10-CM | POA: Diagnosis not present

## 2024-04-02 DIAGNOSIS — I10 Essential (primary) hypertension: Secondary | ICD-10-CM | POA: Diagnosis not present

## 2024-04-02 DIAGNOSIS — M47816 Spondylosis without myelopathy or radiculopathy, lumbar region: Secondary | ICD-10-CM | POA: Diagnosis not present

## 2024-05-09 ENCOUNTER — Ambulatory Visit (INDEPENDENT_AMBULATORY_CARE_PROVIDER_SITE_OTHER)

## 2024-05-09 VITALS — Ht 69.0 in | Wt 330.0 lb

## 2024-05-09 DIAGNOSIS — Z0000 Encounter for general adult medical examination without abnormal findings: Secondary | ICD-10-CM

## 2024-05-09 NOTE — Patient Instructions (Signed)
  Mr. Jonathan Villarreal , Thank you for taking time to come for your Medicare Wellness Visit. I appreciate your ongoing commitment to your health goals. Please review the following plan we discussed and let me know if I can assist you in the future.   These are the goals we discussed:  Goals       Patient Stated (pt-stated)      05/02/2023 AWV Goal: Exercise for General Health  Patient will verbalize understanding of the benefits of increased physical activity: Exercising regularly is important. It will improve your overall fitness, flexibility, and endurance. Regular exercise also will improve your overall health. It can help you control your weight, reduce stress, and improve your bone density. Over the next year, patient will increase physical activity as tolerated with a goal of at least 150 minutes of moderate physical activity per week.  You can tell that you are exercising at a moderate intensity if your heart starts beating faster and you start breathing faster but can still hold a conversation. Moderate-intensity exercise ideas include: Walking 1 mile (1.6 km) in about 15 minutes Biking Hiking Golfing Dancing Water aerobics Patient will verbalize understanding of everyday activities that increase physical activity by providing examples like the following: Yard work, such as: Insurance underwriter Gardening Washing windows or floors Patient will be able to explain general safety guidelines for exercising:  Before you start a new exercise program, talk with your health care provider. Do not exercise so much that you hurt yourself, feel dizzy, or get very short of breath. Wear comfortable clothes and wear shoes with good support. Drink plenty of water while you exercise to prevent dehydration or heat stroke. Work out until your breathing and your heartbeat get faster.       Patient Stated      Patient states he  would like to lose weight.         This is a list of the screening recommended for you and due dates:  Health Maintenance  Topic Date Due   HIV Screening  Never done   Hepatitis C Screening  Never done   Pneumococcal Vaccine for age over 73 (1 of 2 - PCV) Never done   Hepatitis B Vaccine (1 of 3 - 19+ 3-dose series) Never done   Zoster (Shingles) Vaccine (1 of 2) Never done   Colon Cancer Screening  Never done   COVID-19 Vaccine (3 - Pfizer risk series) 12/21/2020   Screening for Lung Cancer  03/01/2023   Flu Shot  04/12/2024   Medicare Annual Wellness Visit  05/09/2025   DTaP/Tdap/Td vaccine (3 - Td or Tdap) 05/01/2027   HPV Vaccine  Aged Out   Meningitis B Vaccine  Aged Out

## 2024-05-09 NOTE — Progress Notes (Signed)
 Subjective:   Jonathan Villarreal is a 53 y.o. male who presents for Medicare Annual/Subsequent preventive examination.  Visit Complete: Virtual I connected with  Jonathan Villarreal on 05/09/24 by a audio enabled telemedicine application and verified that I am speaking with the correct person using two identifiers.  Patient Location: Home  Provider Location: Office/Clinic  I discussed the limitations of evaluation and management by telemedicine. The patient expressed understanding and agreed to proceed.  Vital Signs: Because this visit was a virtual/telehealth visit, some criteria may be missing or patient reported. Any vitals not documented were not able to be obtained and vitals that have been documented are patient reported.  Patient Medicare AWV questionnaire was completed by the patient on n/a; I have confirmed that all information answered by patient is correct and no changes since this date.  Cardiac Risk Factors include: obesity (BMI >30kg/m2);sedentary lifestyle;hypertension;male gender;dyslipidemia;smoking/ tobacco exposure     Objective:    Today's Vitals   05/09/24 1021 05/09/24 1022  Weight: (!) 330 lb (149.7 kg)   Height: 5' 9 (1.753 m)   PainSc:  6    Body mass index is 48.73 kg/m.     05/09/2024   10:34 AM 05/02/2023   11:08 AM  Advanced Directives  Does Patient Have a Medical Advance Directive? No No  Would patient like information on creating a medical advance directive? No - Patient declined No - Patient declined    Current Medications (verified) Outpatient Encounter Medications as of 05/09/2024  Medication Sig   albuterol  (PROVENTIL ) (2.5 MG/3ML) 0.083% nebulizer solution TAKE 3 MLS (2.5 MG TOTAL) BY NEBULIZATION EVERY 4 (FOUR) HOURS AS NEEDED FOR WHEEZING OR SHORTNESS OF BREATH (PLEASE INCLUDE NEBULIZER MACHINE, HOSES, AND MASK IF NEEDED.).   Albuterol  Sulfate (PROAIR  RESPICLICK) 108 (90 Base) MCG/ACT AEPB Inhale 2 puffs into the lungs every 6 (six) hours as needed  (cough, wheeze, SOB).   AMBULATORY NON FORMULARY MEDICATION Incentive spirometer to be used q2-3h.   amLODipine  (NORVASC ) 5 MG tablet TAKE 1 TABLET (5 MG TOTAL) BY MOUTH DAILY.   atorvastatin  (LIPITOR) 10 MG tablet Take 1 tablet (10 mg total) by mouth daily at 6 PM.   buPROPion  (WELLBUTRIN  XL) 150 MG 24 hr tablet TAKE 1 TABLET DAILY FOR A MONTH THEN 1 TAB TWICE A DAY   cyclobenzaprine  (FLEXERIL ) 10 MG tablet One half to one tab PO qHS, then increase gradually to one tab TID.   DULoxetine (CYMBALTA) 20 MG capsule Take by mouth.   indomethacin  (INDOCIN ) 50 MG capsule TAKE 1 CAPSULE (50 MG TOTAL) BY MOUTH 2 (TWO) TIMES DAILY WITH A MEAL.   lisinopril -hydrochlorothiazide  (ZESTORETIC ) 20-25 MG tablet TAKE 1 TABLET BY MOUTH EVERY DAY   naloxone (NARCAN) nasal spray 4 mg/0.1 mL as directed.   oxyCODONE -acetaminophen  (PERCOCET) 10-325 MG tablet Take 1 tablet by mouth every 8 (eight) hours as needed for pain.   Respiratory Therapy Supplies (NEBULIZER AIR TUBE/PLUGS) MISC Use as needed, tubing, mask   sertraline  (ZOLOFT ) 50 MG tablet Take 1.5 tablets (75 mg total) by mouth daily.   sildenafil  (VIAGRA ) 50 MG tablet TAKE 1 TABLET BY MOUTH DAILY AS NEEDED FOR ERECTILE DYSFUNCTION   SYMBICORT  160-4.5 MCG/ACT inhaler INHALE 1 PUFF INTO THE LUNGS TWICE A DAY   tirzepatide  (ZEPBOUND ) 2.5 MG/0.5ML Pen Inject 2.5 mg into the skin once a week.   ibuprofen  (ADVIL ) 800 MG tablet TAKE 1 TABLET BY MOUTH EVERY 8 HOURS AS NEEDED (Patient not taking: Reported on 05/09/2024)   [DISCONTINUED] chlorpheniramine-HYDROcodone  (TUSSIONEX) 10-8  MG/5ML Take 5 mLs by mouth every 12 (twelve) hours as needed for cough (cough, will cause drowsiness.).   [DISCONTINUED] oseltamivir  (TAMIFLU ) 75 MG capsule Take 1 capsule (75 mg total) by mouth 2 (two) times daily. (Patient not taking: Reported on 12/05/2023)   [DISCONTINUED] predniSONE  (DELTASONE ) 50 MG tablet One tab PO daily for 5 days. (Patient not taking: Reported on 12/05/2023)   No  facility-administered encounter medications on file as of 05/09/2024.    Allergies (verified) Bee venom and Dust mite extract   History: Past Medical History:  Diagnosis Date   Asthma    Past Surgical History:  Procedure Laterality Date   APPENDECTOMY     Family History  Problem Relation Age of Onset   Diabetes Mother    Hypertension Father    Social History   Socioeconomic History   Marital status: Legally Separated    Spouse name: Not on file   Number of children: 2   Years of education: 7   Highest education level: 7th grade  Occupational History   Occupation: disabled  Tobacco Use   Smoking status: Former    Current packs/day: 0.50    Types: Cigarettes   Smokeless tobacco: Never  Vaping Use   Vaping status: Never Used  Substance and Sexual Activity   Alcohol use: Yes    Alcohol/week: 0.0 standard drinks of alcohol   Drug use: No   Sexual activity: Not on file  Other Topics Concern   Not on file  Social History Narrative   Lives alone. He has two kids. His daughter visits often to help. He enjoys fishing but hasn't been able to do due to knee pain.   Social Drivers of Corporate investment banker Strain: Low Risk  (05/09/2024)   Overall Financial Resource Strain (CARDIA)    Difficulty of Paying Living Expenses: Not hard at all  Food Insecurity: No Food Insecurity (05/09/2024)   Hunger Vital Sign    Worried About Running Out of Food in the Last Year: Never true    Ran Out of Food in the Last Year: Never true  Transportation Needs: No Transportation Needs (05/09/2024)   PRAPARE - Administrator, Civil Service (Medical): No    Lack of Transportation (Non-Medical): No  Physical Activity: Inactive (05/09/2024)   Exercise Vital Sign    Days of Exercise per Week: 0 days    Minutes of Exercise per Session: 0 min  Stress: No Stress Concern Present (05/09/2024)   Jonathan Villarreal of Occupational Health - Occupational Stress Questionnaire    Feeling of  Stress: Not at all  Social Connections: Socially Isolated (05/09/2024)   Social Connection and Isolation Panel    Frequency of Communication with Friends and Family: More than three times a week    Frequency of Social Gatherings with Friends and Family: Three times a week    Attends Religious Services: Never    Active Member of Clubs or Organizations: No    Attends Banker Meetings: Never    Marital Status: Separated    Tobacco Counseling Counseling given: Not Answered   Clinical Intake:  Pre-visit preparation completed: Yes  Pain : 0-10 Pain Score: 6  Pain Type: Chronic pain Pain Location: Back Pain Orientation: Lower Pain Descriptors / Indicators: Throbbing Pain Onset: More than a month ago Pain Frequency: Constant     BMI - recorded: 48.73 Nutritional Status: BMI > 30  Obese Nutritional Risks: None Diabetes: No  How often do you need  to have someone help you when you read instructions, pamphlets, or other written materials from your doctor or pharmacy?: 4 - Often What is the last grade level you completed in school?: 8  Interpreter Needed?: No      Activities of Daily Living    05/09/2024   10:24 AM  In your present state of health, do you have any difficulty performing the following activities:  Hearing? 1  Vision? 1  Difficulty concentrating or making decisions? 1  Walking or climbing stairs? 1  Dressing or bathing? 1  Doing errands, shopping? 0  Preparing Food and eating ? N  Using the Toilet? N  In the past six months, have you accidently leaked urine? Y  Do you have problems with loss of bowel control? N  Managing your Medications? N  Managing your Finances? N  Housekeeping or managing your Housekeeping? Y    No care team member to display  Indicate any recent Medical Services you may have received from other than [facility] providers in the past year (date may be approximate).     Assessment:   This is a routine wellness examination  for Jonathan Villarreal.  Hearing/Vision screen No results found.   Goals Addressed             This Visit's Progress    Patient Stated       Patient states he would like to lose weight.        Depression Screen    05/09/2024   10:33 AM 05/02/2023   11:09 AM 05/06/2021   10:01 AM 02/02/2021    8:42 AM 12/03/2020   12:20 PM 03/19/2020   10:02 AM 01/18/2018    3:28 PM  PHQ 2/9 Scores  PHQ - 2 Score 0 1 6 3 5  0 0  PHQ- 9 Score   21 10 20       Fall Risk    05/09/2024   10:35 AM 05/02/2023   11:09 AM  Fall Risk   Falls in the past year? 0 1  Number falls in past yr: 0 0  Injury with Fall? 0 0  Risk for fall due to : No Fall Risks Impaired mobility;History of fall(s)  Follow up Falls evaluation completed Falls evaluation completed;Education provided;Falls prevention discussed    MEDICARE RISK AT HOME: Medicare Risk at Home Any stairs in or around the home?: Yes If so, are there any without handrails?: Yes Home free of loose throw rugs in walkways, pet beds, electrical cords, etc?: Yes Adequate lighting in your home to reduce risk of falls?: Yes Life alert?: No Use of a cane, walker or w/c?: Yes Grab bars in the bathroom?: Yes Shower chair or bench in shower?: Yes Elevated toilet seat or a handicapped toilet?: No  TIMED UP AND GO:  Was the test performed?  No    Cognitive Function:        05/09/2024   10:35 AM 05/02/2023   11:16 AM  6CIT Screen  What Year? 4 points 0 points  What month? 0 points 0 points  What time? 0 points 0 points  Count back from 20 0 points 0 points  Months in reverse 4 points 4 points  Repeat phrase 2 points 4 points  Total Score 10 points 8 points    Immunizations Immunization History  Administered Date(s) Administered   INFLUENZA, HIGH DOSE SEASONAL PF 05/11/2017, 06/19/2020   Influenza Inj Mdck Quad Pf 05/18/2022   Influenza,inj,Quad PF,6+ Mos 05/11/2017, 05/26/2019, 06/19/2020, 08/11/2021  Influenza,inj,Quad PF,6-35 Mos 05/26/2019    Influenza-Unspecified 06/19/2015, 06/19/2023   MMR 04/21/2016   PFIZER(Purple Top)SARS-COV-2 Vaccination 11/02/2020, 11/23/2020   PPD Test 04/22/2016   Tdap 11/17/2014, 04/30/2017    TDAP status: Up to date  Flu Vaccine status: Due, Education has been provided regarding the importance of this vaccine. Advised may receive this vaccine at local pharmacy or Health Dept. Aware to provide a copy of the vaccination record if obtained from local pharmacy or Health Dept. Verbalized acceptance and understanding.   Covid-19 vaccine status: Declined, Education has been provided regarding the importance of this vaccine but patient still declined. Advised may receive this vaccine at local pharmacy or Health Dept.or vaccine clinic. Aware to provide a copy of the vaccination record if obtained from local pharmacy or Health Dept. Verbalized acceptance and understanding.  Qualifies for Shingles Vaccine? No   Zostavax completed No   Shingrix Completed?: No.    Education has been provided regarding the importance of this vaccine. Patient has been advised to call insurance company to determine out of pocket expense if they have not yet received this vaccine. Advised may also receive vaccine at local pharmacy or Health Dept. Verbalized acceptance and understanding.  Screening Tests Health Maintenance  Topic Date Due   HIV Screening  Never done   Hepatitis C Screening  Never done   Pneumococcal Vaccine: 50+ Years (1 of 2 - PCV) Never done   Hepatitis B Vaccines 19-59 Average Risk (1 of 3 - 19+ 3-dose series) Never done   Zoster Vaccines- Shingrix (1 of 2) Never done   Colonoscopy  Never done   COVID-19 Vaccine (3 - Pfizer risk series) 12/21/2020   Lung Cancer Screening  03/01/2023   INFLUENZA VACCINE  04/12/2024   Medicare Annual Wellness (AWV)  05/09/2025   DTaP/Tdap/Td (3 - Td or Tdap) 05/01/2027   HPV VACCINES  Aged Out   Meningococcal B Vaccine  Aged Out    Health Maintenance  Health Maintenance  Due  Topic Date Due   HIV Screening  Never done   Hepatitis C Screening  Never done   Pneumococcal Vaccine: 50+ Years (1 of 2 - PCV) Never done   Hepatitis B Vaccines 19-59 Average Risk (1 of 3 - 19+ 3-dose series) Never done   Zoster Vaccines- Shingrix (1 of 2) Never done   Colonoscopy  Never done   COVID-19 Vaccine (3 - Pfizer risk series) 12/21/2020   Lung Cancer Screening  03/01/2023   INFLUENZA VACCINE  04/12/2024   Colorectal cancer screening - patient declined.   Lung Cancer Screening: (Low Dose CT Chest recommended if Age 39-80 years, 20 pack-year currently smoking OR have quit w/in 15years.) does not qualify.   Lung Cancer Screening Referral: n/a  Additional Screening:  Hepatitis C Screening: does qualify; Completed not yet  Vision Screening: Recommended annual ophthalmology exams for early detection of glaucoma and other disorders of the eye. Is the patient up to date with their annual eye exam?  Yes  Who is the provider or what is the name of the office in which the patient attends annual eye exams? Doesn't remember If pt is not established with a provider, would they like to be referred to a provider to establish care? N/a.   Dental Screening: Recommended annual dental exams for proper oral hygiene   Community Resource Referral / Chronic Care Management: CRR required this visit?  No   CCM required this visit?  No     Plan:  I have personally reviewed and noted the following in the patient's chart:   Medical and social history Use of alcohol, tobacco or illicit drugs  Current medications and supplements including opioid prescriptions. Patient is currently taking opioid prescriptions. Information provided to patient regarding non-opioid alternatives. Patient advised to discuss non-opioid treatment plan with their provider. Functional ability and status Nutritional status Physical activity Advanced directives List of other physicians Hospitalizations,  surgeries, and ER visits in previous 12 months. None Vitals Screenings to include cognitive, depression, and falls Referrals and appointments  In addition, I have reviewed and discussed with patient certain preventive protocols, quality metrics, and best practice recommendations. A written personalized care plan for preventive services as well as general preventive health recommendations were provided to patient.     Jonathan Villarreal, [place]   05/09/2024   After Visit Summary: (MyChart) Due to this being a telephonic visit, the after visit summary with patients personalized plan was offered to patient via MyChart   Nurse Notes:   Jonathan Villarreal is a 53 y.o. male patient who had a Medicare Annual Wellness Visit today via telephone. Jonathan Villarreal is Disabled and lives alone. He has 2 children. He reports that he is socially active and does interact with friends/family regularly. He is minimally physically active and enjoys fishing but hasn't been able to do it due to knee pain.

## 2024-05-14 ENCOUNTER — Encounter: Payer: Self-pay | Admitting: Sports Medicine

## 2024-06-27 ENCOUNTER — Telehealth: Payer: Self-pay | Admitting: Medical-Surgical

## 2024-06-27 NOTE — Telephone Encounter (Signed)
 Copied from CRM [id number]. Topic: Appointments - Scheduling Inquiry for Clinic >> Jun 27, 2024 11:12 AM Jonathan Villarreal wrote: Reason for CRM: patient is calling for him and [name],Jonathan Villarreal. He would like to est care with [facility]. He was a previous pt of md T. Please advise [phone]

## 2024-06-28 NOTE — Telephone Encounter (Signed)
 In case they ask can they come on the same day?

## 2024-07-03 DIAGNOSIS — M170 Bilateral primary osteoarthritis of knee: Secondary | ICD-10-CM | POA: Diagnosis not present

## 2024-07-03 DIAGNOSIS — G894 Chronic pain syndrome: Secondary | ICD-10-CM | POA: Diagnosis not present

## 2024-07-03 DIAGNOSIS — Z79891 Long term (current) use of opiate analgesic: Secondary | ICD-10-CM | POA: Diagnosis not present

## 2024-07-03 DIAGNOSIS — M47816 Spondylosis without myelopathy or radiculopathy, lumbar region: Secondary | ICD-10-CM | POA: Diagnosis not present

## 2024-07-03 DIAGNOSIS — Z5181 Encounter for therapeutic drug level monitoring: Secondary | ICD-10-CM | POA: Diagnosis not present

## 2024-07-03 DIAGNOSIS — I10 Essential (primary) hypertension: Secondary | ICD-10-CM | POA: Diagnosis not present

## 2024-07-09 NOTE — Progress Notes (Signed)
 Jonathan Villarreal                                          MRN: [id number]   07/09/2024   The [facility]BCI Quality Team Specialist reviewed this patient medical record for the purposes of chart review for care gap closure. The following were reviewed: chart review for care gap closure-colorectal cancer screening.    VBCI Quality Team

## 2024-07-12 DIAGNOSIS — M170 Bilateral primary osteoarthritis of knee: Secondary | ICD-10-CM | POA: Diagnosis not present

## 2024-07-16 ENCOUNTER — Ambulatory Visit (INDEPENDENT_AMBULATORY_CARE_PROVIDER_SITE_OTHER): Admitting: Medical-Surgical

## 2024-07-16 ENCOUNTER — Encounter: Payer: Self-pay | Admitting: Medical-Surgical

## 2024-07-16 ENCOUNTER — Other Ambulatory Visit: Payer: Self-pay | Admitting: Medical-Surgical

## 2024-07-16 VITALS — BP 110/71 | HR 70 | Resp 20 | Ht 69.0 in | Wt 315.1 lb

## 2024-07-16 DIAGNOSIS — F419 Anxiety disorder, unspecified: Secondary | ICD-10-CM

## 2024-07-16 DIAGNOSIS — F32A Depression, unspecified: Secondary | ICD-10-CM

## 2024-07-16 DIAGNOSIS — Z7689 Persons encountering health services in other specified circumstances: Secondary | ICD-10-CM

## 2024-07-16 DIAGNOSIS — M5416 Radiculopathy, lumbar region: Secondary | ICD-10-CM

## 2024-07-16 DIAGNOSIS — J4520 Mild intermittent asthma, uncomplicated: Secondary | ICD-10-CM

## 2024-07-16 DIAGNOSIS — G2581 Restless legs syndrome: Secondary | ICD-10-CM | POA: Insufficient documentation

## 2024-07-16 DIAGNOSIS — Z23 Encounter for immunization: Secondary | ICD-10-CM

## 2024-07-16 DIAGNOSIS — M170 Bilateral primary osteoarthritis of knee: Secondary | ICD-10-CM

## 2024-07-16 DIAGNOSIS — E782 Mixed hyperlipidemia: Secondary | ICD-10-CM | POA: Diagnosis not present

## 2024-07-16 DIAGNOSIS — I10 Essential (primary) hypertension: Secondary | ICD-10-CM

## 2024-07-16 DIAGNOSIS — G894 Chronic pain syndrome: Secondary | ICD-10-CM

## 2024-07-16 DIAGNOSIS — N529 Male erectile dysfunction, unspecified: Secondary | ICD-10-CM

## 2024-07-16 DIAGNOSIS — Z1211 Encounter for screening for malignant neoplasm of colon: Secondary | ICD-10-CM

## 2024-07-16 MED ORDER — ATORVASTATIN CALCIUM 10 MG PO TABS: 10.0000 mg | ORAL_TABLET | Freq: Every day | ORAL | 3 refills | Status: DC

## 2024-07-16 MED ORDER — NEBULIZER AIR TUBE/PLUGS MISC: 0 refills | Status: AC

## 2024-07-16 MED ORDER — ALBUTEROL SULFATE (2.5 MG/3ML) 0.083% IN NEBU: 2.5000 mg | INHALATION_SOLUTION | RESPIRATORY_TRACT | 11 refills | Status: AC | PRN

## 2024-07-16 MED ORDER — SERTRALINE HCL 50 MG PO TABS: 75.0000 mg | ORAL_TABLET | Freq: Every day | ORAL | 4 refills | Status: AC

## 2024-07-16 MED ORDER — INDOMETHACIN 50 MG PO CAPS: 50.0000 mg | ORAL_CAPSULE | Freq: Two times a day (BID) | ORAL | 3 refills | Status: AC

## 2024-07-16 MED ORDER — AMLODIPINE BESYLATE 5 MG PO TABS: 5.0000 mg | ORAL_TABLET | Freq: Every day | ORAL | 1 refills | Status: AC

## 2024-07-16 MED ORDER — SILDENAFIL CITRATE 50 MG PO TABS: ORAL_TABLET | ORAL | 0 refills | Status: DC

## 2024-07-16 MED ORDER — BUDESONIDE-FORMOTEROL FUMARATE 160-4.5 MCG/ACT IN AERO: INHALATION_SPRAY | RESPIRATORY_TRACT | 11 refills | Status: AC

## 2024-07-16 MED ORDER — LISINOPRIL-HYDROCHLOROTHIAZIDE 20-25 MG PO TABS: 1.0000 | ORAL_TABLET | Freq: Every day | ORAL | 1 refills | Status: AC

## 2024-07-16 MED ORDER — PROAIR RESPICLICK 108 (90 BASE) MCG/ACT IN AEPB: 2.0000 | INHALATION_SPRAY | Freq: Four times a day (QID) | RESPIRATORY_TRACT | 11 refills | Status: DC | PRN
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
## 2024-07-17 ENCOUNTER — Ambulatory Visit: Payer: Self-pay | Admitting: Medical-Surgical

## 2024-07-17 ENCOUNTER — Other Ambulatory Visit: Payer: Self-pay | Admitting: Medical-Surgical

## 2024-07-17 DIAGNOSIS — J4520 Mild intermittent asthma, uncomplicated: Secondary | ICD-10-CM

## 2024-07-17 LAB — CMP14+EGFR
ALT: 38 IU/L (ref 0–44)
AST: 28 IU/L (ref 0–40)
Albumin: 4.5 g/dL (ref 3.8–4.9)
Alkaline Phosphatase: 65 IU/L (ref 47–123)
BUN/Creatinine Ratio: 20 (ref 9–20)
BUN: 17 mg/dL (ref 6–24)
Bilirubin Total: 0.4 mg/dL (ref 0.0–1.2)
CO2: 21 mmol/L (ref 20–29)
Calcium: 9.8 mg/dL (ref 8.7–10.2)
Chloride: 100 mmol/L (ref 96–106)
Creatinine, Ser: 0.84 mg/dL (ref 0.76–1.27)
Globulin, Total: 2.6 g/dL (ref 1.5–4.5)
Glucose: 98 mg/dL (ref 70–99)
Potassium: 4.6 mmol/L (ref 3.5–5.2)
Sodium: 137 mmol/L (ref 134–144)
Total Protein: 7.1 g/dL (ref 6.0–8.5)
eGFR: 106 mL/min/1.73 (ref >59)

## 2024-07-17 LAB — LIPID PANEL
Chol/HDL Ratio: 3 ratio (ref 0.0–5.0)
Cholesterol, Total: 182 mg/dL (ref 100–199)
HDL: 61 mg/dL (ref >39)
LDL Chol Calc (NIH): 95 mg/dL (ref 0–99)
Triglycerides: 152 mg/dL — ABNORMAL HIGH (ref 0–149)
VLDL Cholesterol Cal: 26 mg/dL (ref 5–40)

## 2024-07-17 LAB — CBC
Hematocrit: 41.6 % (ref 37.5–51.0)
Hemoglobin: 13.5 g/dL (ref 13.0–17.7)
MCH: 31.2 pg (ref 26.6–33.0)
MCHC: 32.5 g/dL (ref 31.5–35.7)
MCV: 96 fL (ref 79–97)
Platelets: 413 x10E3/uL (ref 150–450)
RBC: 4.33 x10E6/uL (ref 4.14–5.80)
RDW: 12.3 % (ref 11.6–15.4)
WBC: 12 x10E3/uL — ABNORMAL HIGH (ref 3.4–10.8)

## 2024-07-17 NOTE — Assessment & Plan Note (Signed)
 Followed by pain management. Also using Flexeril  and indomethacin  as adjunct medications.  - Follow up with pain management as instructed.

## 2024-07-17 NOTE — Assessment & Plan Note (Signed)
 Returns today with continued bilateral knee pain secondary to OA. Injections have worked well in the past. Was prescribed tirzepatide  to aid weight loss but unable to get insurance approval. Exercise limited by knee pain hindering weight loss efforts. Continue to follow up with pain management. If further intervention is needed, happy to place a referral.

## 2024-07-17 NOTE — Assessment & Plan Note (Signed)
 Hypertension well managed with lisinopril -hydrochlorothiazide  20-25mg  and amlodipine  5mg  daily. - BP at goal.  - Continue current regimen. - Follow a low sodium heart healthy diet. - Recommend home monitoring with a goal of 130/80 or less.

## 2024-07-17 NOTE — Assessment & Plan Note (Signed)
 Well managed with sertraline  75mg  daily. No SE or intolerances. Mood is stable with no SI/HI.  - Continue Sertraline  as prescribed.

## 2024-07-17 NOTE — Assessment & Plan Note (Signed)
 Mild intermittent asthma well-managed with Symbicort  and albuterol  nebulizer.  - Continue Symbicort  1 puff twice daily. - Continue prn albuterol  inhaler/nebulizer.

## 2024-07-17 NOTE — Assessment & Plan Note (Signed)
 Followed by the pain clinic.

## 2024-07-17 NOTE — Assessment & Plan Note (Signed)
 Previously treated with Lipitor but patient self discontinued. Not currently interested in restarting a statin medication. Updating lipid panel today.

## 2024-07-17 NOTE — Assessment & Plan Note (Signed)
 Managed with sildenafil  with good medication effects. Happy with the current treatment and does not desire change in medication or dose. Continue sildenafil  as prescribed.

## 2024-07-23 ENCOUNTER — Telehealth: Payer: Self-pay

## 2024-07-23 NOTE — Telephone Encounter (Signed)
 Copied from CRM [id number]. Topic: General - Other >> Jul 23, 2024 12:29 PM Shanda MATSU wrote: Reason for CRM: Patient called in wanting to know if he can stop by tomorrow to pick up asthma machine as he will be in the area tomorrow, attempted to call [facility] to confirm but clinic is on lunch, patient is req a call back to confirm.

## 2024-07-23 NOTE — Telephone Encounter (Signed)
 That is fine. I spoke with the patient 07/22/2024 and advised him that it will be at the front desk for him to sign for and to pick up at his convenience.

## 2024-08-10 LAB — COLOGUARD: COLOGUARD: NEGATIVE

## 2024-08-16 ENCOUNTER — Other Ambulatory Visit: Payer: Self-pay | Admitting: Medical-Surgical

## 2024-08-16 DIAGNOSIS — J4520 Mild intermittent asthma, uncomplicated: Secondary | ICD-10-CM

## 2024-08-22 NOTE — Telephone Encounter (Signed)
 WE CANT FILL THIS SEND TO MEDICAL SU[facility]Y [facility]ORE.

## 2024-10-16 ENCOUNTER — Other Ambulatory Visit: Payer: Self-pay | Admitting: Medical-Surgical

## 2024-10-16 DIAGNOSIS — N529 Male erectile dysfunction, unspecified: Secondary | ICD-10-CM

## 2024-10-16 NOTE — Telephone Encounter (Signed)
 Last filled 07/16/2024  Last OV 07/16/2024  Upcoming Appointment 01/14/2025

## 2025-01-14 ENCOUNTER — Ambulatory Visit: Admitting: Medical-Surgical
